# Patient Record
Sex: Female | Born: 1995 | Hispanic: Yes | Marital: Single | Smoking: Never smoker
Health system: Southern US, Community
[De-identification: ages and names within clinical notes are randomized; demographics above are authoritative.]

## PROBLEM LIST (undated history)

## (undated) ENCOUNTER — Inpatient Hospital Stay (HOSPITAL_COMMUNITY): Payer: Self-pay

## (undated) DIAGNOSIS — O139 Gestational [pregnancy-induced] hypertension without significant proteinuria, unspecified trimester: Secondary | ICD-10-CM

## (undated) DIAGNOSIS — R1110 Vomiting, unspecified: Secondary | ICD-10-CM

## (undated) HISTORY — PX: NO PAST SURGERIES: SHX2092

---
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
## 2016-07-12 IMAGING — US US OB COMP LESS 14 WK
1 series · 13 of 28 positions shown · non-contrast
Comparison: None.

CLINICAL DATA: Pelvic pain and vomiting. Estimated gestational age
by LMP is 6 weeks 0 days. Quantitative beta HCG is [DATE].

EXAM:
OBSTETRIC <14 WK US AND TRANSVAGINAL OB US
TECHNIQUE: Both transabdominal and transvaginal ultrasound examinations were
performed for complete evaluation of the gestation as well as the
maternal uterus, adnexal regions, and pelvic cul-de-sac.
Transvaginal technique was performed to assess early pregnancy.

[Series 1: us ob comp less 14 wk · 0.08mm/px · 13 of 54 slices shown]
[im 2/54]
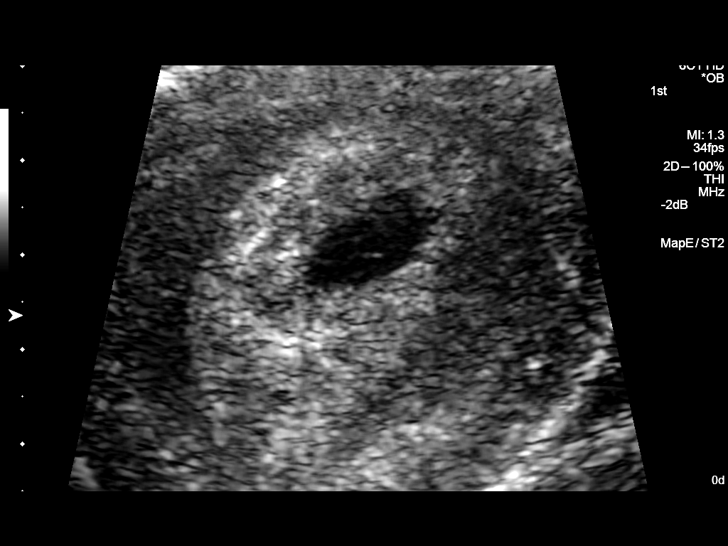
[im 6/54]
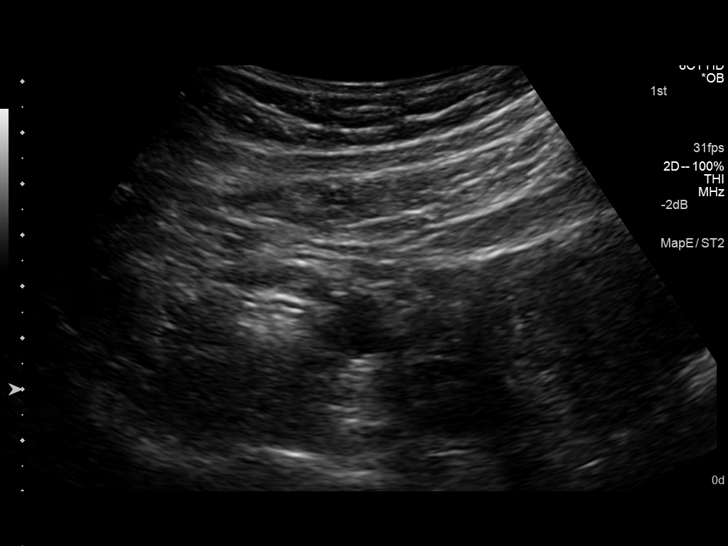
[im 10/54]
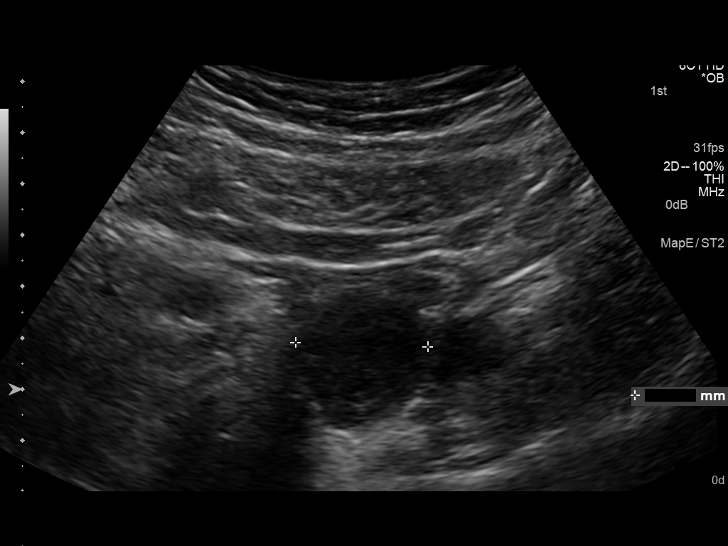
[im 14/54]
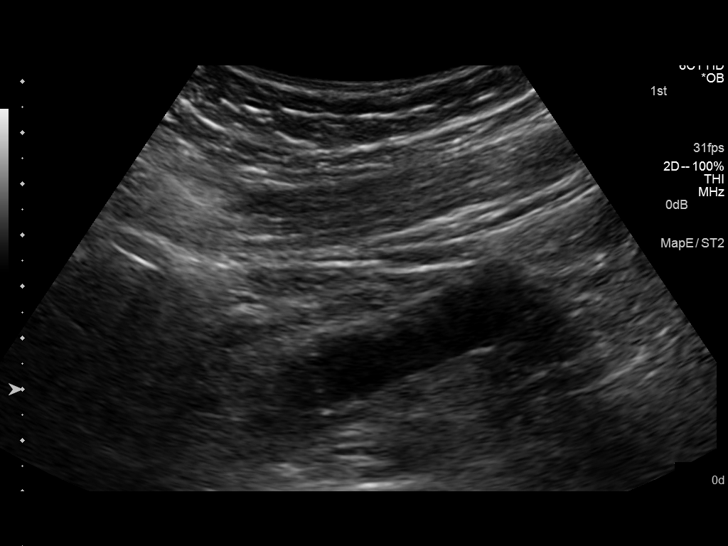
[im 18/54]
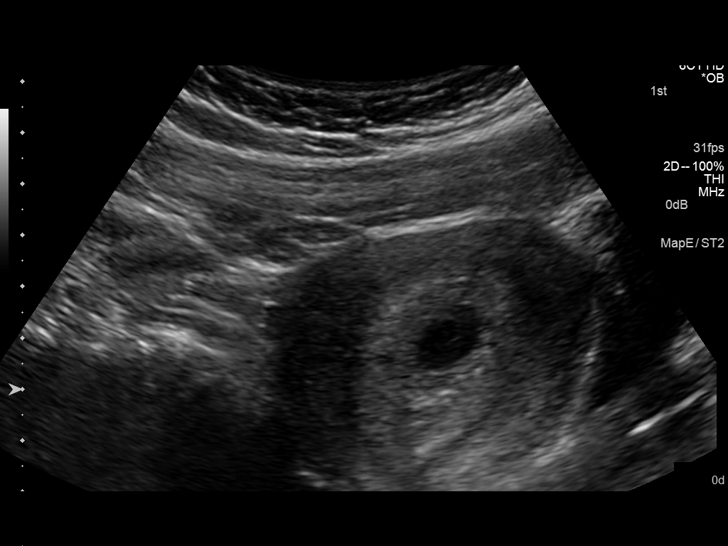
[im 22/54]
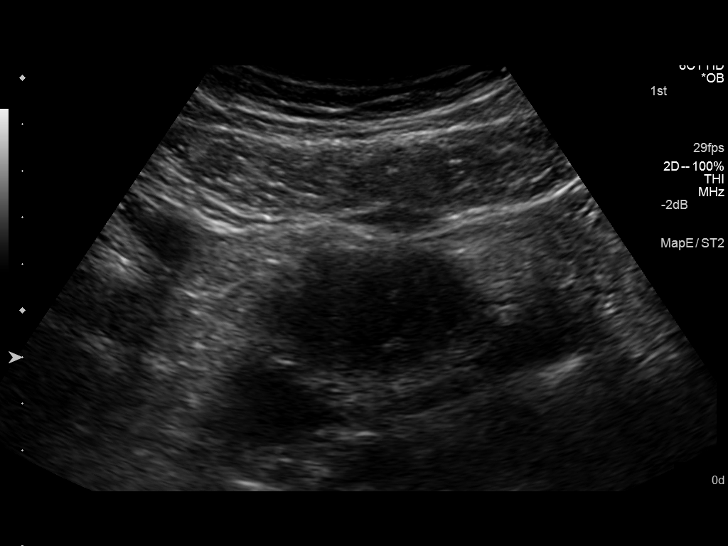
[im 28/54]
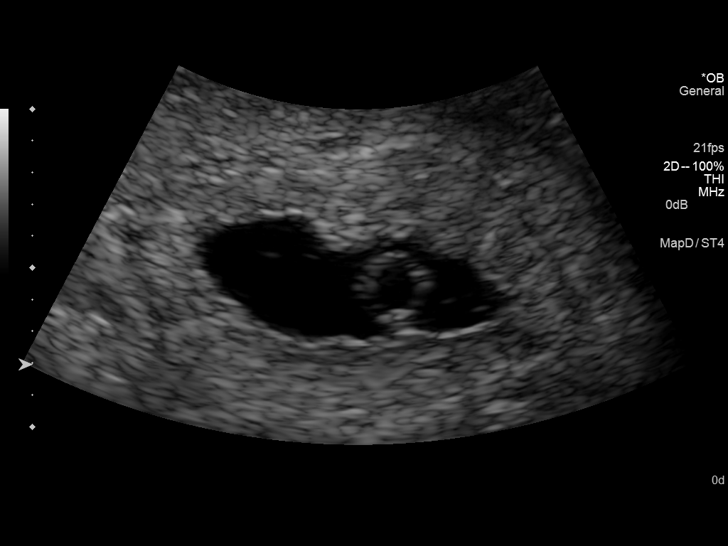
[im 32/54]
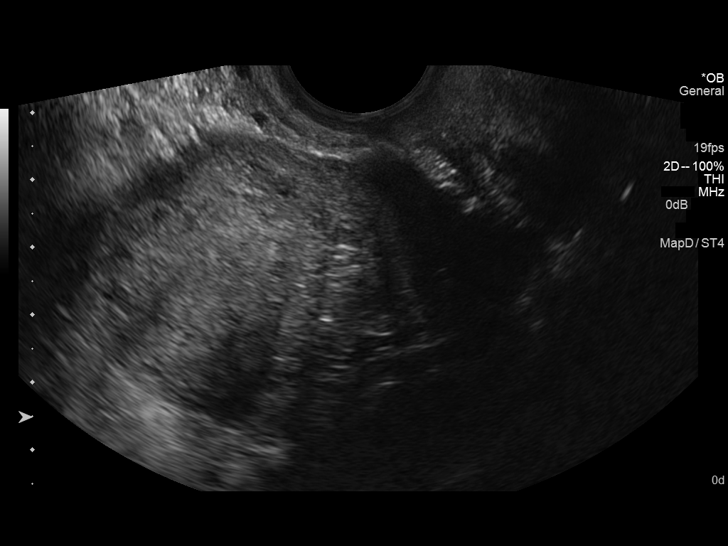
[im 36/54]
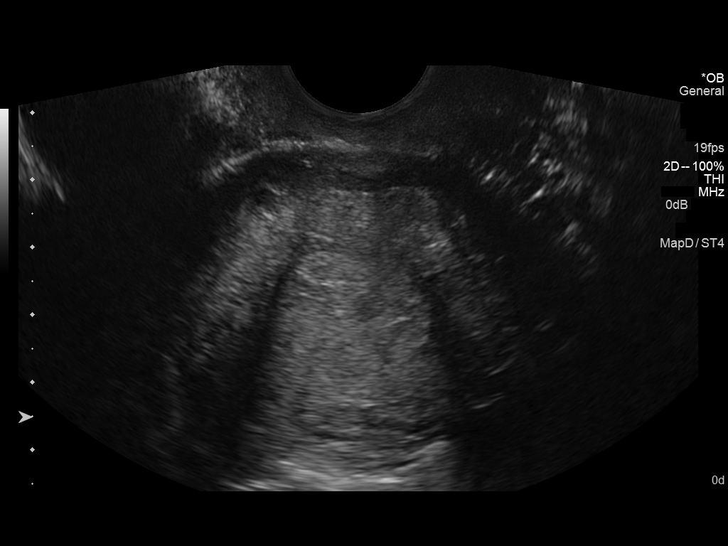
[im 40/54]
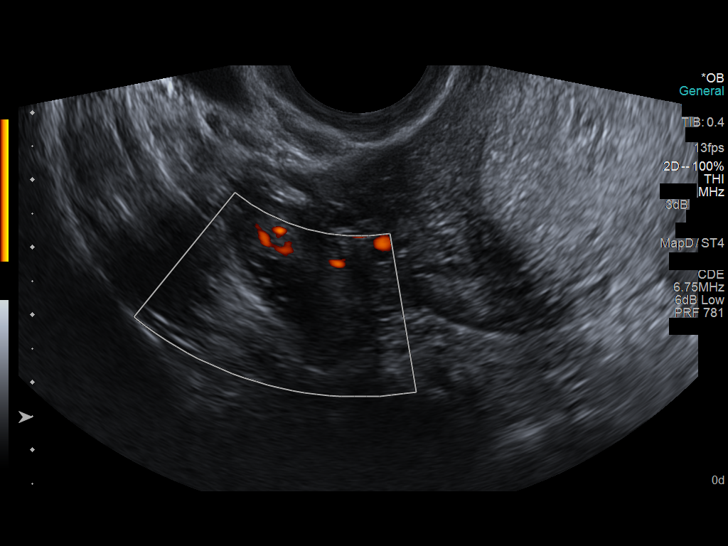
[im 44/54]
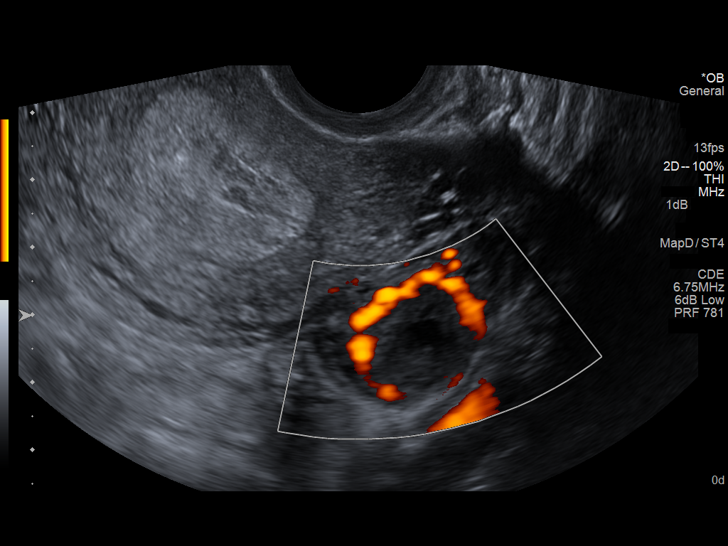
[im 48/54]
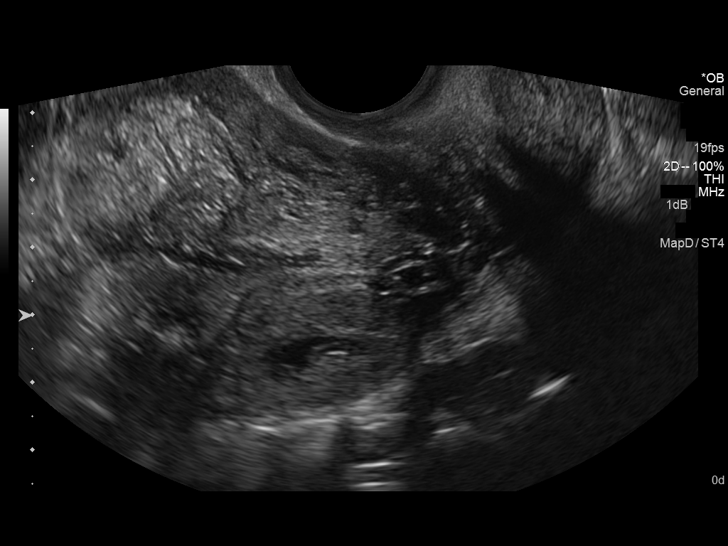
[im 52/54]
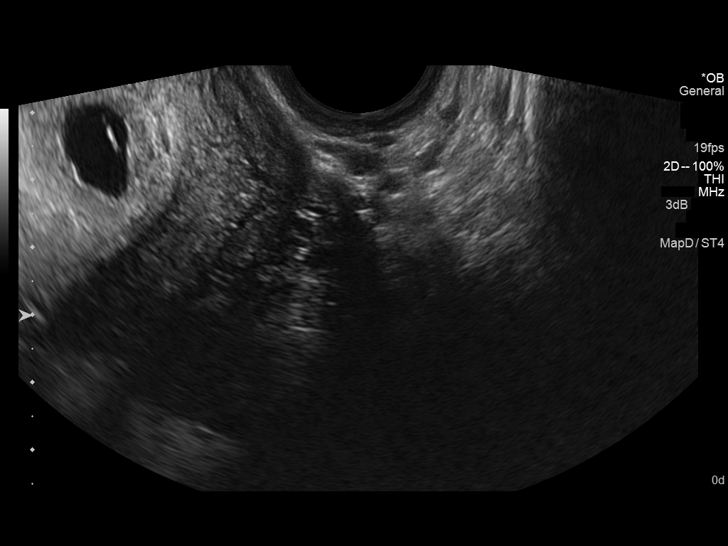

[13 of 28 positions shown; findings below may reference images not displayed]

FINDINGS: Intrauterine gestational sac: A single intrauterine gestational sac
is demonstrated.

Yolk sac:  Present

Embryo:  Present

Cardiac Activity: Present

Heart Rate:  91 bpm

CRL:   2.2  mm   5 w 5 d                  US EDC: 08/03/2014

Maternal uterus/adnexae: Uterus is anteverted. No myometrial mass
lesions identified. No subchorionic hemorrhage. Both ovaries are
identified. Right ovary appears normal. Cystic structure in the left
ovary with peripheral flow demonstrated consistent with corpus
luteum cyst. Trace free fluid around the left adnexal.
IMPRESSION: Single intrauterine pregnancy. Estimated gestational age by
crown-rump length is 5 weeks 5 days. Corpus luteum cyst on the left.
No acute complication is identified appear

## 2017-01-19 ENCOUNTER — Inpatient Hospital Stay (HOSPITAL_COMMUNITY)
Admission: AD | Admit: 2017-01-19 | Discharge: 2017-01-19 | Disposition: A | Discharge status: Home / Self Care | Payer: Self-pay | Source: Ambulatory Visit | Attending: Obstetrics & Gynecology | Admitting: Obstetrics & Gynecology

## 2017-01-19 ENCOUNTER — Encounter (HOSPITAL_COMMUNITY): Payer: Self-pay

## 2017-01-19 DIAGNOSIS — O26891 Other specified pregnancy related conditions, first trimester: Secondary | ICD-10-CM | POA: Insufficient documentation

## 2017-01-19 DIAGNOSIS — Z8759 Personal history of other complications of pregnancy, childbirth and the puerperium: Secondary | ICD-10-CM

## 2017-01-19 DIAGNOSIS — Z3A01 Less than 8 weeks gestation of pregnancy: Secondary | ICD-10-CM | POA: Insufficient documentation

## 2017-01-19 DIAGNOSIS — O210 Mild hyperemesis gravidarum: Secondary | ICD-10-CM | POA: Insufficient documentation

## 2017-01-19 DIAGNOSIS — R030 Elevated blood-pressure reading, without diagnosis of hypertension: Secondary | ICD-10-CM | POA: Insufficient documentation

## 2017-01-19 DIAGNOSIS — Z3480 Encounter for supervision of other normal pregnancy, unspecified trimester: Secondary | ICD-10-CM

## 2017-01-19 LAB — COMPREHENSIVE METABOLIC PANEL
ALT: 21 U/L (ref 14–54)
ANION GAP: 10 (ref 5–15)
AST: 26 U/L (ref 15–41)
Albumin: 4.7 g/dL (ref 3.5–5.0)
Alkaline Phosphatase: 48 U/L (ref 38–126)
BILIRUBIN TOTAL: 1 mg/dL (ref 0.3–1.2)
BUN: 17 mg/dL (ref 6–20)
CO2: 19 mmol/L — AB (ref 22–32)
Calcium: 9.4 mg/dL (ref 8.9–10.3)
Chloride: 105 mmol/L (ref 101–111)
Creatinine, Ser: 0.66 mg/dL (ref 0.44–1.00)
GFR calc Af Amer: >60 mL/min (ref >60)
GFR calc non Af Amer: >60 mL/min (ref >60)
Glucose, Bld: 74 mg/dL (ref 65–99)
POTASSIUM: 4.1 mmol/L (ref 3.5–5.1)
SODIUM: 134 mmol/L — AB (ref 135–145)
TOTAL PROTEIN: 8.6 g/dL — AB (ref 6.5–8.1)

## 2017-01-19 LAB — URINALYSIS, ROUTINE W REFLEX MICROSCOPIC
BILIRUBIN URINE: NEGATIVE
BILIRUBIN URINE: NEGATIVE
Glucose, UA: NEGATIVE mg/dL
HGB URINE DIPSTICK: NEGATIVE
Hgb urine dipstick: NEGATIVE
KETONES UR: 80 mg/dL — AB
KETONES UR: 80 mg/dL — AB
Nitrite: NEGATIVE
Nitrite: NEGATIVE
PH: 6 (ref 5.0–8.0)
PROTEIN: 100 mg/dL — AB
Protein, ur: NEGATIVE mg/dL
Specific Gravity, Urine: 1.032 — ABNORMAL HIGH (ref 1.005–1.030)
Specific Gravity, Urine: 1.011 (ref 1.005–1.030)
pH: 5 (ref 5.0–8.0)

## 2017-01-19 LAB — CBC
HCT: 39.3 % (ref 36.0–46.0)
Hemoglobin: 14 g/dL (ref 12.0–15.0)
MCH: 30.8 pg (ref 26.0–34.0)
MCHC: 35.6 g/dL (ref 30.0–36.0)
MCV: 86.4 fL (ref 78.0–100.0)
Platelets: 327 10*3/uL (ref 150–400)
RBC: 4.55 MIL/uL (ref 3.87–5.11)
RDW: 12.4 % (ref 11.5–15.5)
WBC: 11.9 10*3/uL — ABNORMAL HIGH (ref 4.0–10.5)

## 2017-01-19 LAB — PREGNANCY, URINE: Preg Test, Ur: POSITIVE — AB

## 2017-01-19 LAB — POCT PREGNANCY, URINE: PREG TEST UR: POSITIVE — AB

## 2017-01-19 MED ORDER — PROMETHAZINE HCL 25 MG PO TABS: 25.0000 mg | ORAL_TABLET | Freq: Four times a day (QID) | ORAL | 1 refills | Status: DC | PRN

## 2017-01-19 MED ORDER — M.V.I. ADULT IV INJ
Freq: Once | INTRAVENOUS | Status: AC
  Administered 2017-01-19: 17:00:00 via INTRAVENOUS
  Filled 2017-01-19: qty 1000

## 2017-01-19 MED ORDER — PROMETHAZINE HCL 25 MG RE SUPP: 25.0000 mg | Freq: Four times a day (QID) | RECTAL | 0 refills | Status: DC | PRN

## 2017-01-19 MED ORDER — PROMETHAZINE HCL 25 MG/ML IJ SOLN
25.0000 mg | Freq: Once | INTRAVENOUS | Status: AC
  Administered 2017-01-19: 25 mg via INTRAVENOUS
  Filled 2017-01-19: qty 1

## 2017-01-19 NOTE — Progress Notes (Addendum)
G2P1 @ [redacted] wksga. Presents to triage for n/v and dehydration. Denies bleeding or cramping. Hx of hyperemises and GHTN so induced @ [redacted] wksga. Denies bleeding.   Hx of hyperemesis and GHTN with previous pregn @ [redacted] wksga.   1547: provider at bs assessing pt.   1614: Awaiting for LR phenergan. Pharmacy notified. Lab at bs and informed pt will need IV so will draw labs via IV site.   1625: Labs and IV started. LR with phenergan up per order on pump set at 52100ml/hr. Labs tubed to lab  1823: Provider at bs reassessing pt. Aware of the high bp 138/95. Up to bathroom   1830: new orders for U/a  1840: provider ordered to d/c pt home.  1845: Discharge instructions given with pt understanding. Pt instructed to call clinic for BP monitoring. Pt verbalizes understanding. Pt left unit via ambulatory with SO.

## 2017-01-19 NOTE — Discharge Instructions (Signed)
Expect the clinic to call you to schedule an appointment to come in in 2 weeks to have your blood pressure checked. Use the BRAT diet - Bananas, rice, applesauce, and toast - no butter or oils - to get you started back with foods or use liquids and then advance to soft foods. Drink at least 8 8-oz glasses of water every day. Take Tylenol 325 mg 2 tablets by mouth every 4 hours if needed for pain. [facility]. Get the medications prescribed from your pharmacy.   Use the Phenergan tablets - they will cause drowsiness. If you are not able to keep down the tablets, you can use the phenergan suppositories in the vagina or in the rectum (hold for at least 20 minutes so the medication can be absorbed). Use Vitamin B6 25 or 50 mg one tablet at bedtime and Unisom (plain) one tablet at bedtime to help with the nausea.  You can use these in addition to the Phenergan tablets.

## 2017-01-19 NOTE — MAU Provider Note (Signed)
History     CSN: [id number]  Arrival date and time: 01/19/17 1502   First Provider Initiated Contact with Patient 01/19/17 1543      Chief Complaint  Patient presents with  . Emesis  . Dehydration   HPI [name] 21 y.o. 6518w0d  Comes to MAU with repetitive vomiting in early pregnancy since Thursday.  Is now starting to feel weak and dizzy.  Had Hyperemesis gravidarium and had IVF several times with her last pregnancy approx. 2.5 years ago.  Does not have any medications at home.  Has noticed her urine is very dark.    Last pregnancy she took Zofran which did not work.  The vomiting resolved at about 14 weeks into the pregnancy.  Also of note, she was induced at 365w4d last pregnancy for gestational hypertension.    OB History    Gravida Para Term Preterm AB Living   2 1 1     1    SAB TAB Ectopic Multiple Live Births         0 1      Past Medical History:  Diagnosis Date  . Medical history non-contributory     Past Surgical History:  Procedure Laterality Date  . NO PAST SURGERIES      Family History  Problem Relation Age of Onset  . Miscarriages / [place] Mother   . Hyperlipidemia Father   . Diabetes Maternal Grandmother   . Hypertension Maternal Grandfather     Social History  Substance Use Topics  . Smoking status: Never Smoker  . Smokeless tobacco: Never Used  . Alcohol use No    Allergies: No Known Allergies  Prescriptions Prior to Admission  Medication Sig Dispense Refill Last Dose  . ibuprofen (ADVIL,MOTRIN) 600 MG tablet Take 1 tablet (600 mg total) by mouth every 6 (six) hours. 30 tablet 0   . oxyCODONE-acetaminophen (PERCOCET/ROXICET) 5-325 MG per tablet Take 1 tablet by mouth every 4 (four) hours as needed (for pain scale 4-7). 30 tablet 0   . Prenatal Vit-Fe Fumarate-FA (PRENATAL MULTIVITAMIN) TABS tablet Take 1 tablet by mouth daily.    07/21/2014 at Unknown time    Review of Systems  Constitutional: Negative for fever.   Gastrointestinal: Positive for nausea and vomiting. Negative for abdominal pain and diarrhea.  Genitourinary: Negative for dysuria, vaginal bleeding and vaginal discharge.   Physical Exam   Temperature 99 F (37.2 C), temperature source Oral, resp. rate 18, last menstrual period 12/08/2016, unknown if currently breastfeeding.  Physical Exam  Nursing note and vitals reviewed. Constitutional: She is oriented to person, place, and time. She appears well-developed and well-nourished.  HENT:  Head: Normocephalic.  Eyes: EOM are normal.  Neck: Neck supple.  GI: Soft. There is no tenderness.  Musculoskeletal: Normal range of motion.  Neurological: She is alert and oriented to person, place, and time.  Skin: Skin is warm and dry.  Psychiatric: She has a normal mood and affect.    MAU Course  Procedures Results for orders placed or performed during the hospital encounter of 01/19/17 (from the past 24 hour(s))  Pregnancy, urine     Status: Abnormal   Collection Time: 01/19/17  3:30 PM  Result Value Ref Range   Preg Test, Ur POSITIVE (A) NEGATIVE  Urinalysis, Routine w reflex microscopic     Status: Abnormal   Collection Time: 01/19/17  3:30 PM  Result Value Ref Range   Color, Urine YELLOW YELLOW   APPearance CLOUDY (A) CLEAR   Specific  Gravity, Urine 1.032 (H) 1.005 - 1.030   pH 5.0 5.0 - 8.0   Glucose, UA NEGATIVE NEGATIVE mg/dL   Hgb urine dipstick NEGATIVE NEGATIVE   Bilirubin Urine NEGATIVE NEGATIVE   Ketones, ur 80 (A) NEGATIVE mg/dL   Protein, ur 696 (A) NEGATIVE mg/dL   Nitrite NEGATIVE NEGATIVE   Leukocytes, UA MODERATE (A) NEGATIVE   RBC / HPF 6-30 0 - 5 RBC/hpf   WBC, UA TOO NUMEROUS TO COUNT 0 - 5 WBC/hpf   Bacteria, UA MANY (A) NONE SEEN   Squamous Epithelial / LPF 6-30 (A) NONE SEEN   Mucus PRESENT   Pregnancy, urine POC     Status: Abnormal   Collection Time: 01/19/17  3:35 PM  Result Value Ref Range   Preg Test, Ur POSITIVE (A) NEGATIVE  CBC     Status:  Abnormal   Collection Time: 01/19/17  4:25 PM  Result Value Ref Range   WBC 11.9 (H) 4.0 - 10.5 K/uL   RBC 4.55 3.87 - 5.11 MIL/uL   Hemoglobin 14.0 12.0 - 15.0 g/dL   HCT 29.5 28.4 - 13.2 %   MCV 86.4 78.0 - 100.0 fL   MCH 30.8 26.0 - 34.0 pg   MCHC 35.6 30.0 - 36.0 g/dL   RDW 44.0 10.2 - 72.5 %   Platelets 327 150 - 400 K/uL  Comprehensive metabolic panel     Status: Abnormal   Collection Time: 01/19/17  4:25 PM  Result Value Ref Range   Sodium 134 (L) 135 - 145 mmol/L   Potassium 4.1 3.5 - 5.1 mmol/L   Chloride 105 101 - 111 mmol/L   CO2 19 (L) 22 - 32 mmol/L   Glucose, Bld 74 65 - 99 mg/dL   BUN 17 6 - 20 mg/dL   Creatinine, Ser 3.66 0.44 - 1.00 mg/dL   Calcium 9.4 8.9 - 44.0 mg/dL   Total Protein 8.6 (H) 6.5 - 8.1 g/dL   Albumin 4.7 3.5 - 5.0 g/dL   AST 26 15 - 41 U/L   ALT 21 14 - 54 U/L   Alkaline Phosphatase 48 38 - 126 U/L   Total Bilirubin 1.0 0.3 - 1.2 mg/dL   GFR calc non Af Amer >60 >60 mL/min   GFR calc Af Amer >60 >60 mL/min   Anion gap 10 5 - 15    MDM With the repetitive vomiting and feeling dizzy, will give IVF today - one bag of LR with Phenergan 25 mg and one bag of MVI.  Discussed with client Vitamin B6 with Unisom at night - Does not have insurance.  Discussed prescribing Phenergan tablets and Phenergan suppositories if unable to keep down the tablets.    BP at discharge 139/95 - consult with Dr. Erin Fulling - will arrange for a problem visit in the clinic to recheck vomiting and BP.  [facility]  Will repeat UA today after IVF.  Assessment and Plan  Vomiting in first trimester with dehydration Elevated BP without diagnosis of hypertension  Plan [facility] to [facility] care. Get the medications prescribed from your pharmacy.   Use the Phenergan tablets - they will cause drowsiness. If you are not able to keep down the tablets, you can use the phenergan suppositories  in the vagina or in the rectum (hold for at least 20 minutes so the medication can be absorbed). Use Vitamin B6 25 or 50 mg one tablet  at bedtime and Unisom (plain) one tablet at bedtime to help with the nausea.  You can use these in addition to the Phenergan tablets. Expect the clinic to call to schedule an appointment to schedule and appointment in 2 weeks to come in and have your blood pressure checked.  Jeanluc Wegman L Sumner Boesch 01/19/2017, 3:53 PM

## 2017-01-24 ENCOUNTER — Telehealth: Payer: Self-pay | Admitting: Advanced Practice Midwife

## 2017-01-24 ENCOUNTER — Encounter (HOSPITAL_COMMUNITY): Payer: Self-pay | Admitting: *Deleted

## 2017-01-24 ENCOUNTER — Inpatient Hospital Stay (HOSPITAL_COMMUNITY)
Admission: AD | Admit: 2017-01-24 | Discharge: 2017-01-24 | Disposition: A | Discharge status: Home / Self Care | Payer: Self-pay | Source: Ambulatory Visit | Attending: Obstetrics and Gynecology | Admitting: Obstetrics and Gynecology

## 2017-01-24 DIAGNOSIS — O219 Vomiting of pregnancy, unspecified: Secondary | ICD-10-CM

## 2017-01-24 DIAGNOSIS — Z3480 Encounter for supervision of other normal pregnancy, unspecified trimester: Secondary | ICD-10-CM

## 2017-01-24 DIAGNOSIS — O26891 Other specified pregnancy related conditions, first trimester: Secondary | ICD-10-CM | POA: Insufficient documentation

## 2017-01-24 DIAGNOSIS — R112 Nausea with vomiting, unspecified: Secondary | ICD-10-CM | POA: Insufficient documentation

## 2017-01-24 DIAGNOSIS — R0602 Shortness of breath: Secondary | ICD-10-CM | POA: Insufficient documentation

## 2017-01-24 DIAGNOSIS — Z8759 Personal history of other complications of pregnancy, childbirth and the puerperium: Secondary | ICD-10-CM

## 2017-01-24 DIAGNOSIS — Z3A01 Less than 8 weeks gestation of pregnancy: Secondary | ICD-10-CM | POA: Insufficient documentation

## 2017-01-24 DIAGNOSIS — R12 Heartburn: Secondary | ICD-10-CM | POA: Insufficient documentation

## 2017-01-24 LAB — CBC
HCT: 40.8 % (ref 36.0–46.0)
Hemoglobin: 14.9 g/dL (ref 12.0–15.0)
MCH: 30.9 pg (ref 26.0–34.0)
MCHC: 36.5 g/dL — ABNORMAL HIGH (ref 30.0–36.0)
MCV: 84.6 fL (ref 78.0–100.0)
PLATELETS: 294 10*3/uL (ref 150–400)
RBC: 4.82 MIL/uL (ref 3.87–5.11)
RDW: 12.5 % (ref 11.5–15.5)
WBC: 12.7 10*3/uL — ABNORMAL HIGH (ref 4.0–10.5)

## 2017-01-24 LAB — COMPREHENSIVE METABOLIC PANEL
ALT: 26 U/L (ref 14–54)
AST: 24 U/L (ref 15–41)
Albumin: 5.1 g/dL — ABNORMAL HIGH (ref 3.5–5.0)
Alkaline Phosphatase: 56 U/L (ref 38–126)
Anion gap: 16 — ABNORMAL HIGH (ref 5–15)
BUN: 20 mg/dL (ref 6–20)
CHLORIDE: 103 mmol/L (ref 101–111)
CO2: 12 mmol/L — ABNORMAL LOW (ref 22–32)
Calcium: 10 mg/dL (ref 8.9–10.3)
Creatinine, Ser: 0.79 mg/dL (ref 0.44–1.00)
Glucose, Bld: 90 mg/dL (ref 65–99)
Potassium: 4.2 mmol/L (ref 3.5–5.1)
Sodium: 131 mmol/L — ABNORMAL LOW (ref 135–145)
Total Bilirubin: 1.2 mg/dL (ref 0.3–1.2)
Total Protein: 9.2 g/dL — ABNORMAL HIGH (ref 6.5–8.1)

## 2017-01-24 LAB — URINALYSIS, ROUTINE W REFLEX MICROSCOPIC
BILIRUBIN URINE: NEGATIVE
GLUCOSE, UA: NEGATIVE mg/dL
KETONES UR: 80 mg/dL — AB
Nitrite: NEGATIVE
Protein, ur: 100 mg/dL — AB
SPECIFIC GRAVITY, URINE: 1.032 — AB (ref 1.005–1.030)
pH: 5 (ref 5.0–8.0)

## 2017-01-24 MED ORDER — PROMETHAZINE HCL 25 MG/ML IJ SOLN
25.0000 mg | Freq: Four times a day (QID) | INTRAMUSCULAR | Status: DC | PRN
  Administered 2017-01-24: 25 mg via INTRAVENOUS
  Filled 2017-01-24: qty 1

## 2017-01-24 MED ORDER — FAMOTIDINE IN NACL 20-0.9 MG/50ML-% IV SOLN
20.0000 mg | Freq: Once | INTRAVENOUS | Status: AC
  Administered 2017-01-24: 20 mg via INTRAVENOUS

## 2017-01-24 MED ORDER — RANITIDINE HCL 150 MG PO TABS: 150.0000 mg | ORAL_TABLET | Freq: Two times a day (BID) | ORAL | 5 refills | Status: DC

## 2017-01-24 MED ORDER — DEXTROSE 5 % IN LACTATED RINGERS IV BOLUS
1000.0000 mL | Freq: Once | INTRAVENOUS | Status: AC
  Administered 2017-01-24: 1000 mL via INTRAVENOUS

## 2017-01-24 MED ORDER — METOCLOPRAMIDE HCL 5 MG/ML IJ SOLN
10.0000 mg | Freq: Once | INTRAMUSCULAR | Status: AC
  Administered 2017-01-24: 10 mg via INTRAVENOUS
  Filled 2017-01-24: qty 2

## 2017-01-24 MED ORDER — PROMETHAZINE HCL 25 MG PO TABS: 25.0000 mg | ORAL_TABLET | Freq: Four times a day (QID) | ORAL | 5 refills | Status: DC | PRN

## 2017-01-24 MED ORDER — FAMOTIDINE IN NACL 20-0.9 MG/50ML-% IV SOLN
INTRAVENOUS | Status: AC
  Filled 2017-01-24: qty 50

## 2017-01-24 MED ORDER — M.V.I. ADULT IV INJ
Freq: Once | INTRAVENOUS | Status: AC
  Administered 2017-01-24: 16:00:00 via INTRAVENOUS
  Filled 2017-01-24: qty 1000

## 2017-01-24 MED ORDER — METOCLOPRAMIDE HCL 10 MG PO TABS: 10.0000 mg | ORAL_TABLET | Freq: Three times a day (TID) | ORAL | 5 refills | Status: DC

## 2017-01-24 NOTE — MAU Note (Signed)
In to assess Patient. Observed Patient sitting up in bed with her hands on her legs. Patient states, "Sometimes my legs will move like this (Patient making jerking motion with her legs), so I have to hold them." Patient denies any pain or discomfort at this time. Instructed Patient to remove her hands from her legs so that the reported jerking motion could be observed. No jerking motion observed at this time. Sharen CounterLisa Leftwich-Kirby, CNM made aware.

## 2017-01-24 NOTE — MAU Provider Note (Signed)
Chief Complaint: Emesis; Shortness of Breath; and Breast Pain   First Provider Initiated Contact with Patient 01/24/17 1430      SUBJECTIVE HPI: Patricia [name] is a 21 y.o. G2P1001 at [redacted]w[redacted]d by LMP who presents to maternity admissions reporting n/v and pt unable to keep down food or fluids for more than 24 hours.  She reports 14 lb weight loss in this pregnancy. She had hyperemesis with last pregnancy and required IV fluids multiple times. She is taking phenergan at home which is not helping. She last took a dose by mouth last night. She has not tried the medication vaginally but reports this was discussed with her at her last MAU visit.  She reports shortness of breath that occurred last night after she was crying and upset about something at home.  She denies shortness of breath now in MAU. There are no other associated symptoms.   She denies abdominal pain, vaginal bleeding, vaginal itching/burning, urinary symptoms, h/a, dizziness, or fever/chills.     HPI  Past Medical History:  Diagnosis Date  . Medical history non-contributory    Past Surgical History:  Procedure Laterality Date  . NO PAST SURGERIES     Social History   Social History  . Marital status: Single    Spouse name: N/A  . Number of children: N/A  . Years of education: N/A   Occupational History  . Not on file.   Social History Main Topics  . Smoking status: Never Smoker  . Smokeless tobacco: Never Used  . Alcohol use No  . Drug use: No  . Sexual activity: Yes    Birth control/ protection: None   Other Topics Concern  . Not on file   Social History Narrative  . No narrative on file   No current facility-administered medications on file prior to encounter.    Current Outpatient Prescriptions on File Prior to Encounter  Medication Sig Dispense Refill  . Prenatal Vit-Fe Fumarate-FA (PRENATAL MULTIVITAMIN) TABS tablet Take 1 tablet by mouth daily.      No Known Allergies  ROS:  Review of Systems   Constitutional: Negative for chills, fatigue and fever.  Respiratory: Negative for shortness of breath.   Cardiovascular: Negative for chest pain.  Gastrointestinal: Positive for nausea and vomiting. Negative for abdominal pain.  Genitourinary: Negative for difficulty urinating, dysuria, flank pain, pelvic pain, vaginal bleeding, vaginal discharge and vaginal pain.  Neurological: Negative for dizziness and headaches.  Psychiatric/Behavioral: Negative.      I have reviewed patient's Past Medical Hx, Surgical Hx, Family Hx, Social Hx, medications and allergies.   Physical Exam   Patient Vitals for the past 24 hrs:  BP Temp Temp src Pulse Resp SpO2 Weight  01/24/17 1730 127/88 98.5 F (36.9 C) Oral 83 16 - -  01/24/17 1636 130/67 99 F (37.2 C) Oral 73 16 - -  01/24/17 1343 (!) 127/95 99.3 F (37.4 C) Oral (!) 108 14 100 % 146 lb 12 oz (66.6 kg)  01/24/17 1341 - - - - - 100 % -   Constitutional: Well-developed, well-nourished female in no acute distress.  HEART: normal rate, heart sounds, regular rhythm RESP: normal effort, lung sounds clear and equal bilaterally GI: Abd soft, non-tender. Pos BS x 4 MS: Extremities nontender, no edema, normal ROM Neurologic: Alert and oriented x 4.  GU: Neg CVAT.  PELVIC EXAM: deferred   LAB RESULTS Results for orders placed or performed during the hospital encounter of 01/24/17 (from the past 24 hour(s))  Urinalysis, Routine w reflex microscopic     Status: Abnormal   Collection Time: 01/24/17  1:47 PM  Result Value Ref Range   Color, Urine AMBER (A) YELLOW   APPearance CLOUDY (A) CLEAR   Specific Gravity, Urine 1.032 (H) 1.005 - 1.030   pH 5.0 5.0 - 8.0   Glucose, UA NEGATIVE NEGATIVE mg/dL   Hgb urine dipstick SMALL (A) NEGATIVE   Bilirubin Urine NEGATIVE NEGATIVE   Ketones, ur 80 (A) NEGATIVE mg/dL   Protein, ur 098 (A) NEGATIVE mg/dL   Nitrite NEGATIVE NEGATIVE   Leukocytes, UA MODERATE (A) NEGATIVE   RBC / HPF 6-30 0 - 5  RBC/hpf   WBC, UA TOO NUMEROUS TO COUNT 0 - 5 WBC/hpf   Bacteria, UA RARE (A) NONE SEEN   Squamous Epithelial / LPF TOO NUMEROUS TO COUNT (A) NONE SEEN   Mucus PRESENT   CBC     Status: Abnormal   Collection Time: 01/24/17  2:40 PM  Result Value Ref Range   WBC 12.7 (H) 4.0 - 10.5 K/uL   RBC 4.82 3.87 - 5.11 MIL/uL   Hemoglobin 14.9 12.0 - 15.0 g/dL   HCT 11.9 14.7 - 82.9 %   MCV 84.6 78.0 - 100.0 fL   MCH 30.9 26.0 - 34.0 pg   MCHC 36.5 (H) 30.0 - 36.0 g/dL   RDW 56.2 13.0 - 86.5 %   Platelets 294 150 - 400 K/uL  Comprehensive metabolic panel     Status: Abnormal   Collection Time: 01/24/17  2:40 PM  Result Value Ref Range   Sodium 131 (L) 135 - 145 mmol/L   Potassium 4.2 3.5 - 5.1 mmol/L   Chloride 103 101 - 111 mmol/L   CO2 12 (L) 22 - 32 mmol/L   Glucose, Bld 90 65 - 99 mg/dL   BUN 20 6 - 20 mg/dL   Creatinine, Ser 7.84 0.44 - 1.00 mg/dL   Calcium 69.6 8.9 - 29.5 mg/dL   Total Protein 9.2 (H) 6.5 - 8.1 g/dL   Albumin 5.1 (H) 3.5 - 5.0 g/dL   AST 24 15 - 41 U/L   ALT 26 14 - 54 U/L   Alkaline Phosphatase 56 38 - 126 U/L   Total Bilirubin 1.2 0.3 - 1.2 mg/dL   GFR calc non Af Amer >60 >60 mL/min   GFR calc Af Amer >60 >60 mL/min   Anion gap 16 (H) 5 - 15       IMAGING No results found.  MAU Management/MDM: Ordered labs and reviewed results.  No evidence of anemia or electrolyte imbalance.  Pt shortness of breath appears associated with emotional upset last night, not with n/v.  She is not SOB today.  Dehydration noted with high specific gravity and 80 ketones so IV fluids given with D5LR x 1000 ml and MVI x 1000 ml. Phenergan 25 mg IV, Pepcid 20 mg IV, and Reglan 10 mg IV given. Pt did have mild reaction to both medications, with 5-10 minutes of restless legs/foot twitching that resolved spontaneously.  Pt reports improvement in n/v symptoms.  Pt reports she has had similar but more mild reaction to PO Phenergan.  Tolerated PO fluids/food in MAU.  D/C home with Rx for  Reglan to take in daytime PRN, Phenergan at night.  Encouraged pt to use Phenergan vaginally when not tolerating PO.  Zantac 150 mg BID daily.  F/U as scheduled at [facility] for prenatal care.  Return to MAU as needed for emergencies.  Pt discharged with strict precautions/reasons to return.  ASSESSMENT 1. Nausea and vomiting during pregnancy prior to [redacted] weeks gestation   2. Supervision of other normal pregnancy, antepartum   3. History of gestational hypertension   4. Heartburn during pregnancy, antepartum, first trimester   5. Shortness of breath due to pregnancy in first trimester     PLAN Discharge home Allergies as of 01/24/2017   No Known Allergies     Medication List    TAKE these medications   metoCLOPramide 10 MG tablet Commonly known as:  REGLAN Take 1 tablet (10 mg total) by mouth 3 (three) times daily before meals.   prenatal multivitamin Tabs tablet Take 1 tablet by mouth daily.   promethazine 25 MG tablet Commonly known as:  PHENERGAN Take 1 tablet (25 mg total) by mouth every 6 (six) hours as needed for nausea or vomiting. What changed:  Another medication with the same name was removed. Continue taking this medication, and follow the directions you see here.   ranitidine 150 MG tablet Commonly known as:  ZANTAC Take 1 tablet (150 mg total) by mouth 2 (two) times daily.      Follow-up Information    [facility].   Why:  As scheduled for prenatal care, return to MAU as needed for emergencies Contact information: [address] [place] [address] [phone]          Addendum:  After pt discharge, reviewed safety of Reglan and Phenergan, given pt mild dystonia reaction in MAU.  Consulted Dr Despina HiddenEure and pharmacy.  Contacted pt to encourage close observation of symptoms. If symptoms worsen or persist, she should d/c meds and seek care.  See separate telephone note.  Sharen CounterLisa Leftwich-Kirby Certified  Nurse-Midwife 01/24/2017  8:15 PM

## 2017-01-24 NOTE — Telephone Encounter (Signed)
Pt was seen tonight in MAU.  After consult with Sue LushAndrea, [facility] pharmacist and with Dr Despina HiddenEure, called pt to discuss changes. Left message for pt to return call.  Pt had mild dystonic reaction to medications given IV in MAU, see MAU note, and reported occasional mild reaction to PO Phenergan in last 2 weeks.  Considered discontinuing Phenergan and Reglan and using Zofran but risks of Zofran in first trimester are also a concern so per Dr Despina HiddenEure, continuing medications with close surveillance is warranted.  Pt ok to take Reglan PRN in the daytime and Phenergan PRN at night but if she has an increase in her reaction to the medication, she should discontinue and follow up with OB/gyn or in MAU.

## 2017-01-24 NOTE — MAU Note (Signed)
Had been taking Promethazine, stopped working yesterday.  Continued to throw up all day. Has lost 14 # with the preg. Since last night she has felt like she can't breath, can't catch her breath. (talking with out pause)  Last night she started having pain in her rt nipple, feels like she is being poked.

## 2017-01-25 LAB — CULTURE, OB URINE: Culture: NO GROWTH

## 2017-01-27 ENCOUNTER — Inpatient Hospital Stay (HOSPITAL_COMMUNITY): Payer: Self-pay

## 2017-01-27 ENCOUNTER — Encounter (HOSPITAL_COMMUNITY): Payer: Self-pay | Admitting: *Deleted

## 2017-01-27 ENCOUNTER — Inpatient Hospital Stay (HOSPITAL_COMMUNITY)
Admission: AD | Admit: 2017-01-27 | Discharge: 2017-01-27 | Disposition: A | Discharge status: Home / Self Care | Payer: Self-pay | Source: Ambulatory Visit | Attending: Obstetrics & Gynecology | Admitting: Obstetrics & Gynecology

## 2017-01-27 DIAGNOSIS — O98811 Other maternal infectious and parasitic diseases complicating pregnancy, first trimester: Secondary | ICD-10-CM | POA: Insufficient documentation

## 2017-01-27 DIAGNOSIS — B373 Candidiasis of vulva and vagina: Secondary | ICD-10-CM | POA: Insufficient documentation

## 2017-01-27 DIAGNOSIS — O9989 Other specified diseases and conditions complicating pregnancy, childbirth and the puerperium: Secondary | ICD-10-CM

## 2017-01-27 DIAGNOSIS — Z8759 Personal history of other complications of pregnancy, childbirth and the puerperium: Secondary | ICD-10-CM

## 2017-01-27 DIAGNOSIS — Z3480 Encounter for supervision of other normal pregnancy, unspecified trimester: Secondary | ICD-10-CM

## 2017-01-27 DIAGNOSIS — O26891 Other specified pregnancy related conditions, first trimester: Secondary | ICD-10-CM | POA: Insufficient documentation

## 2017-01-27 DIAGNOSIS — O209 Hemorrhage in early pregnancy, unspecified: Secondary | ICD-10-CM | POA: Insufficient documentation

## 2017-01-27 DIAGNOSIS — B3731 Acute candidiasis of vulva and vagina: Secondary | ICD-10-CM

## 2017-01-27 DIAGNOSIS — Z3A01 Less than 8 weeks gestation of pregnancy: Secondary | ICD-10-CM | POA: Insufficient documentation

## 2017-01-27 DIAGNOSIS — N898 Other specified noninflammatory disorders of vagina: Secondary | ICD-10-CM | POA: Insufficient documentation

## 2017-01-27 DIAGNOSIS — R109 Unspecified abdominal pain: Secondary | ICD-10-CM | POA: Insufficient documentation

## 2017-01-27 DIAGNOSIS — Z3491 Encounter for supervision of normal pregnancy, unspecified, first trimester: Secondary | ICD-10-CM

## 2017-01-27 DIAGNOSIS — Z8679 Personal history of other diseases of the circulatory system: Secondary | ICD-10-CM | POA: Insufficient documentation

## 2017-01-27 LAB — WET PREP, GENITAL
Clue Cells Wet Prep HPF POC: NONE SEEN
SPERM: NONE SEEN
Trich, Wet Prep: NONE SEEN
Yeast Wet Prep HPF POC: NONE SEEN

## 2017-01-27 LAB — URINALYSIS, ROUTINE W REFLEX MICROSCOPIC
BILIRUBIN URINE: NEGATIVE
GLUCOSE, UA: NEGATIVE mg/dL
Ketones, ur: 80 mg/dL — AB
Nitrite: NEGATIVE
Specific Gravity, Urine: 1.029 (ref 1.005–1.030)
pH: 6 (ref 5.0–8.0)

## 2017-01-27 LAB — CBC
HEMATOCRIT: 36.3 % (ref 36.0–46.0)
Hemoglobin: 13.4 g/dL (ref 12.0–15.0)
MCH: 31.5 pg (ref 26.0–34.0)
MCHC: 36.9 g/dL — AB (ref 30.0–36.0)
MCV: 85.4 fL (ref 78.0–100.0)
PLATELETS: 275 10*3/uL (ref 150–400)
RBC: 4.25 MIL/uL (ref 3.87–5.11)
RDW: 12.3 % (ref 11.5–15.5)
WBC: 13.6 10*3/uL — ABNORMAL HIGH (ref 4.0–10.5)

## 2017-01-27 LAB — HCG, QUANTITATIVE, PREGNANCY: hCG, Beta Chain, Quant, S: 112330 m[IU]/mL — ABNORMAL HIGH (ref <5)

## 2017-01-27 MED ORDER — TERCONAZOLE 0.4 % VA CREA: 1.0000 | TOPICAL_CREAM | Freq: Every day | VAGINAL | 0 refills | Status: DC

## 2017-01-27 NOTE — MAU Note (Signed)
Everything started on Sat night.  Noted red when she peed, some lt brown when she wiped.  Started cramping, sometimes they are sharp. Continues to cramp. Having heavy, watery ?lt green d/c.

## 2017-01-27 NOTE — MAU Note (Signed)
Patient noticed discharge starting on Saturday October27, 2018.  Patient is having cramping.

## 2017-01-27 NOTE — MAU Provider Note (Signed)
History     CSN: [id number]  Arrival date and time: 01/27/17 1700   First Provider Initiated Contact with Patient 01/27/17 1821      Chief Complaint  Patient presents with  . Vaginal Discharge  . Abdominal Cramping   HPI Patricia [name] is a 21 y.o. G2P1001 at [redacted]w[redacted]d by LMP who presents with abdominal pain & vaginal discharge. Symptoms began on Saturday. Reports watery brown discharge. Denies irritation or odor. Also noted some red spotting on toilet paper over the weekend. Intermittent lower abdominal cramping. Rates pain 5/10. Has not treated. Patient has not been sexually active in the last few weeks. Denies dysuria.  OB History    Gravida Para Term Preterm AB Living   2 1 1     1    SAB TAB Ectopic Multiple Live Births         0 1      Past Medical History:  Diagnosis Date  . Medical history non-contributory     Past Surgical History:  Procedure Laterality Date  . NO PAST SURGERIES      Family History  Problem Relation Age of Onset  . Miscarriages / [place] Mother   . Hyperlipidemia Father   . Diabetes Maternal Grandmother   . Hypertension Maternal Grandfather     Social History  Substance Use Topics  . Smoking status: Never Smoker  . Smokeless tobacco: Never Used  . Alcohol use No    Allergies: No Known Allergies  Prescriptions Prior to Admission  Medication Sig Dispense Refill Last Dose  . promethazine (PHENERGAN) 25 MG tablet Take 1 tablet (25 mg total) by mouth every 6 (six) hours as needed for nausea or vomiting. 30 tablet 5 01/27/2017 at Unknown time  . metoCLOPramide (REGLAN) 10 MG tablet Take 1 tablet (10 mg total) by mouth 3 (three) times daily before meals. 30 tablet 5 Unknown at Unknown time  . Prenatal Vit-Fe Fumarate-FA (PRENATAL MULTIVITAMIN) TABS tablet Take 1 tablet by mouth daily.    Unknown at Unknown time  . ranitidine (ZANTAC) 150 MG tablet Take 1 tablet (150 mg total) by mouth 2 (two) times daily. 60 tablet 5 More than a month at  Unknown time    Review of Systems  Constitutional: Negative.   Gastrointestinal: Positive for abdominal pain, nausea and vomiting. Negative for constipation and diarrhea.  Genitourinary: Positive for vaginal bleeding and vaginal discharge. Negative for dyspareunia, dysuria and vaginal pain.   Physical Exam   Blood pressure 133/83, pulse (!) 109, temperature 99.8 F (37.7 C), temperature source Oral, resp. rate 16, weight 148 lb (67.1 kg), last menstrual period 12/08/2016, SpO2 100 %, unknown if currently breastfeeding.  Physical Exam  Nursing note and vitals reviewed. Constitutional: She is oriented to person, place, and time. She appears well-developed and well-nourished. No distress.  HENT:  Head: Normocephalic and atraumatic.  Mouth/Throat: Mucous membranes are dry.  Eyes: Conjunctivae are normal. Right eye exhibits no discharge. Left eye exhibits no discharge. No scleral icterus.  Neck: Normal range of motion.  Respiratory: Effort normal. No respiratory distress.  GI: Soft. She exhibits no distension. There is no tenderness.  Genitourinary: Uterus normal. Cervix exhibits friability. Cervix exhibits no motion tenderness. Right adnexum displays no mass and no tenderness. Left adnexum displays no mass and no tenderness. No bleeding in the vagina. Vaginal discharge (moderate amount of tan watery discharge. Green clumpy discharge adherant to vaginal walls & cervix) found.  Genitourinary Comments: Cervix closed  Neurological: She is alert and oriented to  person, place, and time.  Skin: Skin is warm and dry. She is not diaphoretic.  Psychiatric: She has a normal mood and affect. Her behavior is normal. Judgment and thought content normal.    MAU Course  Procedures Results for orders placed or performed during the hospital encounter of 01/27/17 (from the past 24 hour(s))  Urinalysis, Routine w reflex microscopic     Status: Abnormal   Collection Time: 01/27/17  5:38 PM  Result Value  Ref Range   Color, Urine AMBER (A) YELLOW   APPearance CLOUDY (A) CLEAR   Specific Gravity, Urine 1.029 1.005 - 1.030   pH 6.0 5.0 - 8.0   Glucose, UA NEGATIVE NEGATIVE mg/dL   Hgb urine dipstick LARGE (A) NEGATIVE   Bilirubin Urine NEGATIVE NEGATIVE   Ketones, ur 80 (A) NEGATIVE mg/dL   Protein, ur >=161>=300 (A) NEGATIVE mg/dL   Nitrite NEGATIVE NEGATIVE   Leukocytes, UA MODERATE (A) NEGATIVE   RBC / HPF TOO NUMEROUS TO COUNT 0 - 5 RBC/hpf   WBC, UA TOO NUMEROUS TO COUNT 0 - 5 WBC/hpf   Bacteria, UA FEW (A) NONE SEEN   Squamous Epithelial / LPF TOO NUMEROUS TO COUNT (A) NONE SEEN   WBC Clumps PRESENT    Mucus PRESENT    Non Squamous Epithelial 0-5 (A) NONE SEEN  CBC     Status: Abnormal   Collection Time: 01/27/17  6:25 PM  Result Value Ref Range   WBC 13.6 (H) 4.0 - 10.5 K/uL   RBC 4.25 3.87 - 5.11 MIL/uL   Hemoglobin 13.4 12.0 - 15.0 g/dL   HCT 09.636.3 04.536.0 - 40.946.0 %   MCV 85.4 78.0 - 100.0 fL   MCH 31.5 26.0 - 34.0 pg   MCHC 36.9 (H) 30.0 - 36.0 g/dL   RDW 81.112.3 91.411.5 - 78.215.5 %   Platelets 275 150 - 400 K/uL  hCG, quantitative, pregnancy     Status: Abnormal   Collection Time: 01/27/17  6:25 PM  Result Value Ref Range   hCG, Beta Chain, Quant, S 112,330 (H) <5 mIU/mL  Wet prep, genital     Status: Abnormal   Collection Time: 01/27/17  6:45 PM  Result Value Ref Range   Yeast Wet Prep HPF POC NONE SEEN NONE SEEN   Trich, Wet Prep NONE SEEN NONE SEEN   Clue Cells Wet Prep HPF POC NONE SEEN NONE SEEN   WBC, Wet Prep HPF POC MANY (A) NONE SEEN   Sperm NONE SEEN     MDM +UPT UA, wet prep, GC/chlamydia, CBC, ABO/Rh, quant hCG, HIV, and US today to rule out ectopic pregnancy Will tx for yeast based on exam A positive  Care [facility] CNM    Judeth HornLawrence, Erin, NP 01/27/2017 8:08 PM   MDM: IUP on today's US c/w LMP dates.  Although wet prep negative, clinical evidence of yeast so will treat with Terazol 7. Discussed with pt and may use OTC Monistat 7 or  generic for cost savings.  Pt reports she is doing better with nausea/vomiting despite 80 ketones in UA today. She is keeping down fluids well so encouraged to increase PO fluids/food intake and continue medications. Pt had dystonia reaction to IV phenergan at last MAU visit but reports she has not had any negative reaction to PO meds at home. Encouraged pt to return to MAU if dystonic reaction occurs with meds at home so alternative therapy can be prescribed.  Pt states understanding.    Assessment  and Plan   1. Normal IUP (intrauterine pregnancy) on prenatal ultrasound, first trimester   2. Supervision of other normal pregnancy, antepartum   3. History of gestational hypertension   4. Vaginal bleeding in pregnancy, first trimester   5. Abdominal pain during pregnancy in first trimester   6. Vaginal candidiasis     D/C home with first trimester precautions F/U at [facility] as scheduled Nov 15 Return to MAU as needed for emergencies  Sharen Counter, CNM 9:01 PM

## 2017-01-29 LAB — GC/CHLAMYDIA PROBE AMP (~~LOC~~) NOT AT ARMC
Chlamydia: NEGATIVE
Neisseria Gonorrhea: NEGATIVE

## 2017-02-06 ENCOUNTER — Inpatient Hospital Stay (HOSPITAL_COMMUNITY)
Admission: AD | Admit: 2017-02-06 | Discharge: 2017-02-06 | Disposition: A | Discharge status: Home / Self Care | Payer: Self-pay | Source: Ambulatory Visit | Attending: Obstetrics and Gynecology | Admitting: Obstetrics and Gynecology

## 2017-02-06 ENCOUNTER — Encounter (HOSPITAL_COMMUNITY): Payer: Self-pay

## 2017-02-06 ENCOUNTER — Telehealth: Payer: Self-pay | Admitting: General Practice

## 2017-02-06 DIAGNOSIS — O211 Hyperemesis gravidarum with metabolic disturbance: Secondary | ICD-10-CM | POA: Insufficient documentation

## 2017-02-06 DIAGNOSIS — Z3A08 8 weeks gestation of pregnancy: Secondary | ICD-10-CM | POA: Insufficient documentation

## 2017-02-06 DIAGNOSIS — Z3480 Encounter for supervision of other normal pregnancy, unspecified trimester: Secondary | ICD-10-CM

## 2017-02-06 DIAGNOSIS — Z8759 Personal history of other complications of pregnancy, childbirth and the puerperium: Secondary | ICD-10-CM

## 2017-02-06 DIAGNOSIS — E860 Dehydration: Secondary | ICD-10-CM

## 2017-02-06 DIAGNOSIS — O210 Mild hyperemesis gravidarum: Secondary | ICD-10-CM

## 2017-02-06 HISTORY — DX: Vomiting, unspecified: R11.10

## 2017-02-06 LAB — COMPREHENSIVE METABOLIC PANEL
ALT: 26 U/L (ref 14–54)
ANION GAP: 17 — AB (ref 5–15)
AST: 26 U/L (ref 15–41)
Albumin: 4.5 g/dL (ref 3.5–5.0)
Alkaline Phosphatase: 65 U/L (ref 38–126)
BUN: 15 mg/dL (ref 6–20)
CHLORIDE: 100 mmol/L — AB (ref 101–111)
CO2: 18 mmol/L — ABNORMAL LOW (ref 22–32)
CREATININE: 0.88 mg/dL (ref 0.44–1.00)
Calcium: 9.9 mg/dL (ref 8.9–10.3)
Glucose, Bld: 80 mg/dL (ref 65–99)
POTASSIUM: 4 mmol/L (ref 3.5–5.1)
Sodium: 135 mmol/L (ref 135–145)
Total Bilirubin: 0.7 mg/dL (ref 0.3–1.2)
Total Protein: 8.6 g/dL — ABNORMAL HIGH (ref 6.5–8.1)

## 2017-02-06 LAB — URINALYSIS, ROUTINE W REFLEX MICROSCOPIC
Glucose, UA: NEGATIVE mg/dL
NITRITE: NEGATIVE
Protein, ur: 100 mg/dL — AB
Specific Gravity, Urine: >1.03 — ABNORMAL HIGH (ref 1.005–1.030)
pH: 6 (ref 5.0–8.0)

## 2017-02-06 LAB — CBC
HCT: 40 % (ref 36.0–46.0)
Hemoglobin: 14.4 g/dL (ref 12.0–15.0)
MCH: 30.6 pg (ref 26.0–34.0)
MCHC: 36 g/dL (ref 30.0–36.0)
MCV: 85.1 fL (ref 78.0–100.0)
PLATELETS: 350 10*3/uL (ref 150–400)
RBC: 4.7 MIL/uL (ref 3.87–5.11)
RDW: 12 % (ref 11.5–15.5)
WBC: 12.8 10*3/uL — ABNORMAL HIGH (ref 4.0–10.5)

## 2017-02-06 LAB — URINALYSIS, MICROSCOPIC (REFLEX)

## 2017-02-06 MED ORDER — LACTATED RINGERS IV BOLUS (SEPSIS)
1000.0000 mL | Freq: Once | INTRAVENOUS | Status: AC
  Administered 2017-02-06: 1000 mL via INTRAVENOUS

## 2017-02-06 MED ORDER — ONDANSETRON 4 MG PO TBDP: 4.0000 mg | ORAL_TABLET | Freq: Three times a day (TID) | ORAL | 0 refills | Status: DC | PRN

## 2017-02-06 MED ORDER — DEXTROSE 5 % IN LACTATED RINGERS IV BOLUS
1000.0000 mL | Freq: Once | INTRAVENOUS | Status: AC
  Administered 2017-02-06: 1000 mL via INTRAVENOUS

## 2017-02-06 MED ORDER — ONDANSETRON HCL 4 MG/2ML IJ SOLN
4.0000 mg | Freq: Once | INTRAMUSCULAR | Status: AC
  Administered 2017-02-06: 4 mg via INTRAVENOUS
  Filled 2017-02-06: qty 2

## 2017-02-06 NOTE — MAU Provider Note (Signed)
Chief Complaint: Emesis   First Provider Initiated Contact with Patient 02/06/17 2016      SUBJECTIVE HPI: Patricia [name] is a 21 y.o. G2P1001 at 1261w4d by LMP who presents to maternity admissions reporting worsening n/v of pregnancy in last 2 days. She cannot keep down any food or fluids with vomiting 6-10 times daily.  She is taking Phenergan and it was helping but has not worked x 2 days.  She also reports some mild reaction to Phenergan PO, similar to her reaction to IV Phenergan, in the last week.  She reports some twitching/shaking of her legs when taking the medication.  She never picked up the Reglan that was prescribed.  She reports a 22 lb weight loss during this pregnancy. She denies any associated symptoms.   She denies abdominal pain, vaginal bleeding, vaginal itching/burning, urinary symptoms, h/a, dizziness, or fever/chills.     HPI  Past Medical History:  Diagnosis Date  . Hyperemesis    Past Surgical History:  Procedure Laterality Date  . NO PAST SURGERIES     Social History   Socioeconomic History  . Marital status: Single    Spouse name: Not on file  . Number of children: Not on file  . Years of education: Not on file  . Highest education level: Not on file  Social Needs  . Financial resource strain: Not on file  . Food insecurity - worry: Not on file  . Food insecurity - inability: Not on file  . Transportation needs - medical: Not on file  . Transportation needs - non-medical: Not on file  Occupational History  . Not on file  Tobacco Use  . Smoking status: Never Smoker  . Smokeless tobacco: Never Used  Substance and Sexual Activity  . Alcohol use: No  . Drug use: No  . Sexual activity: Yes    Birth control/protection: None  Other Topics Concern  . Not on file  Social History Narrative  . Not on file   No current facility-administered medications on file prior to encounter.    Current Outpatient Medications on File Prior to Encounter  Medication  Sig Dispense Refill  . metoCLOPramide (REGLAN) 10 MG tablet Take 1 tablet (10 mg total) by mouth 3 (three) times daily before meals. 30 tablet 5  . Prenatal Vit-Fe Fumarate-FA (PRENATAL MULTIVITAMIN) TABS tablet Take 1 tablet by mouth daily.     . promethazine (PHENERGAN) 25 MG tablet Take 1 tablet (25 mg total) by mouth every 6 (six) hours as needed for nausea or vomiting. 30 tablet 5  . ranitidine (ZANTAC) 150 MG tablet Take 1 tablet (150 mg total) by mouth 2 (two) times daily. 60 tablet 5  . terconazole (TERAZOL 7) 0.4 % vaginal cream Place 1 applicator vaginally at bedtime. 45 g 0   No Known Allergies  ROS:  Review of Systems  Constitutional: Negative for chills, fatigue and fever.  Respiratory: Negative for shortness of breath.   Cardiovascular: Negative for chest pain.  Gastrointestinal: Positive for nausea and vomiting. Negative for constipation and diarrhea.  Genitourinary: Negative for difficulty urinating, dysuria, flank pain, pelvic pain, vaginal bleeding, vaginal discharge and vaginal pain.  Neurological: Negative for dizziness and headaches.  Psychiatric/Behavioral: Negative.      I have reviewed patient's Past Medical Hx, Surgical Hx, Family Hx, Social Hx, medications and allergies.   Physical Exam   Patient Vitals for the past 24 hrs:  BP Temp Pulse Resp Height Weight  02/06/17 1726 121/86 98.9 F (37.2 C)  100 18 4\' 11"  (1.499 m) 138 lb (62.6 kg)   Constitutional: Well-developed, well-nourished female in no acute distress.  Cardiovascular: normal rate Respiratory: normal effort GI: Abd soft, non-tender. Pos BS x 4 MS: Extremities nontender, no edema, normal ROM Neurologic: Alert and oriented x 4.  GU: Neg CVAT.   LAB RESULTS Results for orders placed or performed during the hospital encounter of 02/06/17 (from the past 24 hour(s))  Urinalysis, Routine w reflex microscopic     Status: Abnormal   Collection Time: 02/06/17  5:30 PM  Result Value Ref Range    Color, Urine YELLOW YELLOW   APPearance CLOUDY (A) CLEAR   Specific Gravity, Urine >1.030 (H) 1.005 - 1.030   pH 6.0 5.0 - 8.0   Glucose, UA NEGATIVE NEGATIVE mg/dL   Hgb urine dipstick TRACE (A) NEGATIVE   Bilirubin Urine SMALL (A) NEGATIVE   Ketones, ur >80 (A) NEGATIVE mg/dL   Protein, ur 161 (A) NEGATIVE mg/dL   Nitrite NEGATIVE NEGATIVE   Leukocytes, UA MODERATE (A) NEGATIVE  Urinalysis, Microscopic (reflex)     Status: Abnormal   Collection Time: 02/06/17  5:30 PM  Result Value Ref Range   RBC / HPF 0-5 0 - 5 RBC/hpf   WBC, UA 6-30 0 - 5 WBC/hpf   Bacteria, UA FEW (A) NONE SEEN   Squamous Epithelial / LPF 6-30 (A) NONE SEEN  CBC     Status: Abnormal   Collection Time: 02/06/17  6:50 PM  Result Value Ref Range   WBC 12.8 (H) 4.0 - 10.5 K/uL   RBC 4.70 3.87 - 5.11 MIL/uL   Hemoglobin 14.4 12.0 - 15.0 g/dL   HCT 09.6 04.5 - 40.9 %   MCV 85.1 78.0 - 100.0 fL   MCH 30.6 26.0 - 34.0 pg   MCHC 36.0 30.0 - 36.0 g/dL   RDW 81.1 91.4 - 78.2 %   Platelets 350 150 - 400 K/uL  Comprehensive metabolic panel     Status: Abnormal   Collection Time: 02/06/17  6:50 PM  Result Value Ref Range   Sodium 135 135 - 145 mmol/L   Potassium 4.0 3.5 - 5.1 mmol/L   Chloride 100 (L) 101 - 111 mmol/L   CO2 18 (L) 22 - 32 mmol/L   Glucose, Bld 80 65 - 99 mg/dL   BUN 15 6 - 20 mg/dL   Creatinine, Ser 9.56 0.44 - 1.00 mg/dL   Calcium 9.9 8.9 - 21.3 mg/dL   Total Protein 8.6 (H) 6.5 - 8.1 g/dL   Albumin 4.5 3.5 - 5.0 g/dL   AST 26 15 - 41 U/L   ALT 26 14 - 54 U/L   Alkaline Phosphatase 65 38 - 126 U/L   Total Bilirubin 0.7 0.3 - 1.2 mg/dL   GFR calc non Af Amer >60 >60 mL/min   GFR calc Af Amer >60 >60 mL/min   Anion gap 17 (H) 5 - 15       IMAGING US Ob Comp Less 14 Wks  Result Date: 01/27/2017 CLINICAL DATA:  Cramping and vaginal spotting. Positive pregnancy test. EXAM: OBSTETRIC <14 WK [place] AND TRANSVAGINAL OB US TECHNIQUE: Both transabdominal and transvaginal ultrasound examinations  were performed for complete evaluation of the gestation as well as the maternal uterus, adnexal regions, and pelvic cul-de-sac. Transvaginal technique was performed to assess early pregnancy. COMPARISON:  None. FINDINGS: Intrauterine gestational sac: Single. Yolk sac:  Visualized. Embryo:  Visualized. Cardiac Activity: Visualized. Heart Rate: 149  bpm CRL:  7.1  mm   6 w   4 d                  US EDC: 09/18/2017 Subchorionic hemorrhage:  None visualized. Maternal uterus/adnexae: Unremarkable. IMPRESSION: Single living intrauterine gestation at estimated 6 week 4 day gestational age by crown-rump length. Electronically Signed   By: Kennith CenterEric  Mansell M.D.   On: 01/27/2017 20:20    MAU Management/MDM: Ordered labs and reviewed results.  UA c/w dehydration.  CBC, CMP, done and wnl for pregnancy. Consult Dr Earlene Plateravis with assessment and findings. With significant weight loss and pt negative reaction to Phenergan/Reglan, consider offering pt Zofran.  Discussed with the patient the risk of Zofran use in the first trimester of pregnancy. Risks of use in the first trimester include birth defects in babies, specifically cleft lip/palate.  Pt states understanding and plans to use Zofran sparingly.  Zofran 4 mg IV dose given today in MAU. Pt tolerated PO food/fluids. D/C home with plan to use Doxylamine/B6 daily and Zofran 4 mg ODT Q 8 hours PRN.  Pt to keep scheduled appt with [facility] on 11/15 Pt discharged with strict first trimester precautions.  ASSESSMENT 1. [redacted] weeks gestation of pregnancy   2. Supervision of other normal pregnancy, antepartum   3. History of gestational hypertension   4. Hyperemesis gravidarum   5. Dehydration     PLAN Discharge home  Allergies as of 02/06/2017   No Known Allergies     Medication List    STOP taking these medications   metoCLOPramide 10 MG tablet Commonly known as:  REGLAN   promethazine 25 MG tablet Commonly known as:  PHENERGAN   terconazole 0.4 % vaginal  cream Commonly known as:  TERAZOL 7     TAKE these medications   ondansetron 4 MG disintegrating tablet Commonly known as:  ZOFRAN ODT Take 1 tablet (4 mg total) every 8 (eight) hours as needed by mouth for nausea or vomiting.   prenatal multivitamin Tabs tablet Take 1 tablet by mouth daily.   ranitidine 150 MG tablet Commonly known as:  ZANTAC Take 1 tablet (150 mg total) by mouth 2 (two) times daily.      Follow-up Information    [facility]. Schedule an appointment as soon as possible for a visit in 1 week(s).   Contact information: [address] [place] [address] [phone]           Sharen CounterLisa Leftwich-Kirby Certified Nurse-Midwife 02/06/2017  9:11 PM

## 2017-02-06 NOTE — MAU Note (Signed)
Pt reports w she had been havign n/v for about a month. Has been taking promethazine but it stopped working since Monday. Stated her emesis was dark red that last few times. Throat feel scratchy and raw. 10lb WEIGHT LOSS FROM LAST WEEK NOTED TODAY.

## 2017-02-06 NOTE — Telephone Encounter (Signed)
Attempted to call patient to come in for BP check.  Left VM on cell phone for patient to give our office a call.  Unable to reach patient on home # and unable to leave message for patient.

## 2017-02-06 NOTE — Discharge Instructions (Signed)
Nausea medication to take during pregnancy:   Unisom (doxylamine succinate 25 mg tablets) Take one tablet daily at bedtime. If symptoms are not adequately controlled, the dose can be increased to a maximum recommended dose of two tablets daily (1/2 tablet in the morning, 1/2 tablet mid-afternoon and one at bedtime).  Vitamin B6 100mg  tablets. Take one tablet twice a day (up to 200 mg per day).  Add Zofran as prescribed to take as needed.     Medicamentos Nusea para tomar durante el embarazo:  Unisom (succinato de doxilamina 25 mg comprimidos) Tome una tableta al da al acostarse. Si los sntomas no estn adecuadamente controlados, la dosis puede aumentarse hasta una dosis mxima recomendada de Woodssidedos comprimidos al da (medio comprimido por la maana, media tableta a media tarde y otro antes de Macdoelacostarse).  Tabletas de 100 mg de vitamina B6. Tome una Hallibur[employer] Companytableta dos veces al da (hasta 200 mg por da).   Eating Plan for Hyperemesis Gravidarum Hyperemesis gravidarum is a severe form of morning sickness. Because this condition causes severe nausea and vomiting, it can lead to dehydration, malnutrition, and weight loss. One way to lessen the symptoms of nausea and vomiting is to follow the eating plan for hyperemesis gravidarum. It is often used along with prescribed medicines to control your symptoms. What can I do to relieve my symptoms? Listen to your body. Everyone is different and has different preferences. Find what works best for you. Take any of the following actions that are helpful to you:  Eat and drink slowly.  Eat 5-6 small meals daily instead of 3 large meals.  Eat crackers before you get out of bed in the morning.  Try having a snack in the middle of the night.  Starchy foods are usually tolerated well. Examples include cereal, toast, bread, potatoes, pasta, rice, and pretzels.  Ginger may help with nausea. Add  tsp ground ginger to hot tea or choose ginger tea.  Try drinking  100% fruit juice or an electrolyte drink. An electrolyte drink contains sodium, potassium, and chloride.  Continue to take your prenatal vitamins as told by your health care provider. If you are having trouble taking your prenatal vitamins, talk with your health care provider about different options.  Include at least 1 serving of protein with your meals and snacks. Protein options include meats or poultry, beans, nuts, eggs, and yogurt. Try eating a protein-rich snack before bed. Examples of these snacks include cheese and crackers or half of a peanut butter or [place]urkey sandwich.  Consider eliminating foods that trigger your symptoms. These may include spicy foods, coffee, high-fat foods, very sweet foods, and acidic foods.  Try meals that have more protein combined with bland, salty, lower-fat, and dry foods, such as nuts, seeds, pretzels, crackers, and cereal.  Talk with your healthcare provider about starting a supplement of vitamin B6.  Have fluids that are cold, clear, and carbonated or sour. Examples include lemonade, ginger ale, lemon-lime soda, ice water, and sparkling water.  Try lemon or mint tea.  Try brushing your teeth or using a mouth rinse after meals.  What should I avoid to reduce my symptoms? Avoiding some of the following things may help reduce your symptoms.  Foods with strong smells. Try eating meals in well-ventilated areas that are free of odors.  Drinking water or other beverages with meals. Try not to drink anything during the 30 minutes before and after your meals.  Drinking more than 1 cup of fluid at a  time. Sometimes using a straw helps.  Fried or high-fat foods, such as butter and cream sauces.  Spicy foods.  Skipping meals as best as you can. Nausea can be more intense on an empty stomach. If you cannot tolerate food at that time, do not force it. Try sucking on ice chips or other frozen items, and make up for missed calories later.  Lying down within 2  hours after eating.  Environmental triggers. These may include smoky rooms, closed spaces, rooms with strong smells, warm or humid places, overly loud and noisy rooms, and rooms with motion or flickering lights.  Quick and sudden changes in your movement.  This information is not intended to replace advice given to you by your health care provider. Make sure you discuss any questions you have with your health care provider. Document Released: 01/13/2007 Document Revised: 11/15/2015 Document Reviewed: 10/17/2015 Elsevier Interactive Patient Education  2018 [employer].  Hyperemesis Gravidarum Hyperemesis gravidarum is a severe form of nausea and vomiting that happens during pregnancy. Hyperemesis is worse than morning sickness. It may cause you to have nausea or vomiting all day for many days. It may keep you from eating and drinking enough food and liquids. Hyperemesis usually occurs during the first half (the first 20 weeks) of pregnancy. It often goes away once a woman is in her second half of pregnancy. However, sometimes hyperemesis continues through an entire pregnancy. What are the causes? The cause of this condition is not known. It may be related to changes in chemicals (hormones) in the body during pregnancy, such as the high level of pregnancy hormone (human chorionic gonadotropin) or the increase in the female sex hormone (estrogen). What are the signs or symptoms? Symptoms of this condition include:  Severe nausea and vomiting.  Nausea that does not go away.  Vomiting that does not allow you to keep any food down.  Weight loss.  Body fluid loss (dehydration).  Having no desire to eat, or not liking food that you have previously enjoyed.  How is this diagnosed? This condition may be diagnosed based on:  A physical exam.  Your medical history.  Your symptoms.  Blood tests.  Urine tests.  How is this treated? This condition may be managed with medicine. If  medicines to do not help relieve nausea and vomiting, you may need to receive fluids through an IV tube at the hospital. Follow these instructions at home:  Take over-the-counter and prescription medicines only as told by your health care provider.  Avoid iron pills and multivitamins that contain iron for the first 3-4 months of pregnancy. If you take prescription iron pills, do not stop taking them unless your health care provider approves.  Take the following actions to help prevent nausea and vomiting: ? In the morning, before getting out of bed, try eating a couple of dry crackers or a piece of toast. ? Avoid foods and smells that upset your stomach. Fatty and spicy foods may make nausea worse. ? Eat 5-6 small meals a day. ? Do not drink fluids while eating meals. Drink between meals. ? Eat or suck on things that have ginger in them. Ginger can help relieve nausea. ? Avoid food preparation. The smell of food can spoil your appetite or trigger nausea.  Follow instructions from your health care provider about eating or drinking restrictions.  For snacks, eat high-protein foods, such as cheese.  Keep all follow-up and pre-birth (prenatal) visits as told by your health care provider. This  is important. Contact a health care provider if:  You have pain in your abdomen.  You have a severe headache.  You have vision problems.  You are losing weight. Get help right away if:  You cannot drink fluids without vomiting.  You vomit blood.  You have constant nausea and vomiting.  You are very weak.  You are very thirsty.  You feel dizzy.  You faint.  You have a fever or other symptoms that last for more than 2-3 days.  You have a fever and your symptoms suddenly get worse. Summary  Hyperemesis gravidarum is a severe form of nausea and vomiting that happens during pregnancy.  Making some changes to your eating habits may help relieve nausea and vomiting.  This condition may  be managed with medicine.  If medicines to do not help relieve nausea and vomiting, you may need to receive fluids through an IV tube at the hospital. This information is not intended to replace advice given to you by your health care provider. Make sure you discuss any questions you have with your health care provider. Document Released: 03/18/2005 Document Revised: 11/15/2015 Document Reviewed: 11/15/2015 Elsevier Interactive Patient Education  2017 [employer]

## 2017-02-10 ENCOUNTER — Encounter (HOSPITAL_COMMUNITY): Payer: Self-pay | Admitting: *Deleted

## 2017-02-10 ENCOUNTER — Inpatient Hospital Stay (HOSPITAL_COMMUNITY)
Admission: AD | Admit: 2017-02-10 | Discharge: 2017-02-10 | Disposition: A | Discharge status: Home / Self Care | Payer: Self-pay | Source: Ambulatory Visit | Attending: Family Medicine | Admitting: Family Medicine

## 2017-02-10 DIAGNOSIS — O219 Vomiting of pregnancy, unspecified: Secondary | ICD-10-CM

## 2017-02-10 DIAGNOSIS — Z3A09 9 weeks gestation of pregnancy: Secondary | ICD-10-CM | POA: Insufficient documentation

## 2017-02-10 DIAGNOSIS — O210 Mild hyperemesis gravidarum: Secondary | ICD-10-CM | POA: Insufficient documentation

## 2017-02-10 LAB — URINALYSIS, ROUTINE W REFLEX MICROSCOPIC
BACTERIA UA: NONE SEEN
Bilirubin Urine: NEGATIVE
Glucose, UA: NEGATIVE mg/dL
Hgb urine dipstick: NEGATIVE
KETONES UR: 80 mg/dL — AB
NITRITE: NEGATIVE
PROTEIN: 100 mg/dL — AB
Specific Gravity, Urine: 1.03 (ref 1.005–1.030)
pH: 5 (ref 5.0–8.0)

## 2017-02-10 MED ORDER — VITAFOL GUMMIES 3.33-0.333-34.8 MG PO CHEW: 2.0000 | CHEWABLE_TABLET | Freq: Every day | ORAL | 6 refills | Status: AC

## 2017-02-10 MED ORDER — DOXYLAMINE-PYRIDOXINE 10-10 MG PO TBEC: DELAYED_RELEASE_TABLET | ORAL | 6 refills | Status: DC

## 2017-02-10 MED ORDER — SODIUM CHLORIDE 0.9 % IV SOLN
25.0000 mg | Freq: Once | INTRAVENOUS | Status: DC
  Filled 2017-02-10: qty 1

## 2017-02-10 MED ORDER — ONDANSETRON 8 MG PO TBDP
8.0000 mg | ORAL_TABLET | Freq: Once | ORAL | Status: AC
  Administered 2017-02-10: 8 mg via ORAL
  Filled 2017-02-10: qty 1

## 2017-02-10 NOTE — MAU Provider Note (Signed)
  History     CSN: [id number]  Arrival date and time: 02/10/17 1413   None     Chief Complaint  Patient presents with  . Emesis  . Chest Pain   HPI 21 yo G2P1 at 5814w1d here for persistent nausea/emesis in pregnancy. Patient reports unchanged symptoms since her last MAU visit on 11/8. She stopped taking phenergan due to extrapyramidal effects. She was prescribed zofran but has not taken it as she is in the first trimester. Patient reports 10 episodes of emesis today with poor po intake. Patient denies any cramping or vaginal bleeding. [facility] (next [facility] scheduled on 02/13/2017)  OB History    Gravida Para Term Preterm AB Living   2 1 1     1    SAB TAB Ectopic Multiple Live Births         0 1      Past Medical History:  Diagnosis Date  . Hyperemesis   . Hypertension     Past Surgical History:  Procedure Laterality Date  . NO PAST SURGERIES      Family History  Problem Relation Age of Onset  . Miscarriages / [place] Mother   . Hyperlipidemia Father   . Diabetes Maternal Grandmother   . Hypertension Maternal Grandfather     Social History   Tobacco Use  . Smoking status: Never Smoker  . Smokeless tobacco: Never Used  Substance Use Topics  . Alcohol use: No  . Drug use: No    Allergies:  Allergies  Allergen Reactions  . Phenergan [Promethazine Hcl] Other (See Comments)    Leg spasms, extrapyramidal effects  . Reglan [Metoclopramide]     Extrapyramidal effects      Medications Prior to Admission  Medication Sig Dispense Refill Last Dose  . ondansetron (ZOFRAN ODT) 4 MG disintegrating tablet Take 1 tablet (4 mg total) every 8 (eight) hours as needed by mouth for nausea or vomiting. 30 tablet 0 Past Week at Unknown time  . Prenatal Vit-Fe Fumarate-FA (PRENATAL MULTIVITAMIN) TABS tablet Take 1 tablet by mouth daily.    Past Month at Unknown time  . ranitidine (ZANTAC) 150 MG tablet Take 1 tablet (150 mg  total) by mouth 2 (two) times daily. 60 tablet 5 Unknown at Unknown time    Review of Systems  See pertinent in HPI Physical Exam   Blood pressure 132/85, pulse (!) 105, temperature 98.2 F (36.8 C), temperature source Oral, resp. rate 16, weight 135 lb 1.3 oz (61.3 kg), last menstrual period 12/08/2016, SpO2 100 %, unknown if currently breastfeeding.  Physical Exam GENERAL: Well-developed, well-nourished female in no acute distress.  ABDOMEN: Soft, nontender, nondistended. No organomegaly. PELVIC: Not indicated EXTREMITIES: No cyanosis, clubbing, or edema, 2+ distal pulses.  MAU Course  Procedures  MDM Zofran ODT Po hydration Patient tolerated liquids and crackers Assessment and Plan  21 yo G2P1 at 5814w1d with nausea/vomiting in pregnancy - Patient reports improvement in her symptoms following oral zofran - Patient advised to continue taking zofran prn - Rx diclegis provided - Patient to follow up as scheduled on 11/15 for prenatal care - Precautions reviewed - RTC prn  Patricia [name] 02/10/2017, 9:47 PM

## 2017-02-10 NOTE — Discharge Instructions (Signed)
Morning Sickness °Morning sickness is when you feel sick to your stomach (nauseous) during pregnancy. This nauseous feeling may or may not come with vomiting. It often occurs in the morning but can be a problem any time of day. Morning sickness is most common during the first trimester, but it may continue throughout pregnancy. While morning sickness is unpleasant, it is usually harmless unless you develop severe and continual vomiting (hyperemesis gravidarum). This condition requires more intense treatment. °What are the causes? °The cause of morning sickness is not completely known but seems to be related to normal hormonal changes that occur in pregnancy. °What increases the risk? °You are at greater risk if you: °· Experienced nausea or vomiting before your pregnancy. °· Had morning sickness during a previous pregnancy. °· Are pregnant with more than one baby, such as twins. ° °How is this treated? °Do not use any medicines (prescription, over-the-counter, or herbal) for morning sickness without first talking to your health care provider. Your health care provider may prescribe or recommend: °· Vitamin B6 supplements. °· Anti-nausea medicines. °· The herbal medicine ginger. ° °Follow these instructions at home: °· Only take over-the-counter or prescription medicines as directed by your health care provider. °· Taking multivitamins before getting pregnant can prevent or decrease the severity of morning sickness in most women. °· Eat a piece of dry toast or unsalted crackers before getting out of bed in the morning. °· Eat five or six small meals a day. °· Eat dry and bland foods (rice, baked potato). Foods high in carbohydrates are often helpful. °· Do not drink liquids with your meals. Drink liquids between meals. °· Avoid greasy, fatty, and spicy foods. °· Get someone to cook for you if the smell of any food causes nausea and vomiting. °· If you feel nauseous after taking prenatal vitamins, take the vitamins at  night or with a snack. °· Snack on protein foods (nuts, yogurt, cheese) between meals if you are hungry. °· Eat unsweetened gelatins for desserts. °· Wearing an acupressure wristband (worn for sea sickness) may be helpful. °· Acupuncture may be helpful. °· Do not smoke. °· Get a humidifier to keep the air in your house free of odors. °· Get plenty of fresh air. °Contact a health care provider if: °· Your home remedies are not working, and you need medicine. °· You feel dizzy or lightheaded. °· You are losing weight. °Get help right away if: °· You have persistent and uncontrolled nausea and vomiting. °· You pass out (faint). °This information is not intended to replace advice given to you by your health care provider. Make sure you discuss any questions you have with your health care provider. °Document Released: 05/09/2006 Document Revised: 08/24/2015 Document Reviewed: 09/02/2012 °Elsevier Interactive Patient Education © 2017 Elsevier Inc. ° °

## 2017-02-10 NOTE — MAU Note (Signed)
+  N/V Emesis greater than 10 times in the past 24 hours  +chest pain  Mid  Started yesterday Constant Burning in nature Worse with vomiting Rating pain 9/10

## 2017-03-01 ENCOUNTER — Other Ambulatory Visit: Payer: Self-pay

## 2017-03-01 ENCOUNTER — Inpatient Hospital Stay (HOSPITAL_COMMUNITY)
Admission: AD | Admit: 2017-03-01 | Discharge: 2017-03-01 | Disposition: A | Discharge status: Home / Self Care | Payer: Self-pay | Source: Ambulatory Visit | Attending: Obstetrics and Gynecology | Admitting: Obstetrics and Gynecology

## 2017-03-01 DIAGNOSIS — Z3A00 Weeks of gestation of pregnancy not specified: Secondary | ICD-10-CM | POA: Insufficient documentation

## 2017-03-01 DIAGNOSIS — O219 Vomiting of pregnancy, unspecified: Secondary | ICD-10-CM

## 2017-03-01 DIAGNOSIS — Z8759 Personal history of other complications of pregnancy, childbirth and the puerperium: Secondary | ICD-10-CM

## 2017-03-01 DIAGNOSIS — Z760 Encounter for issue of repeat prescription: Secondary | ICD-10-CM | POA: Insufficient documentation

## 2017-03-01 DIAGNOSIS — O210 Mild hyperemesis gravidarum: Secondary | ICD-10-CM | POA: Insufficient documentation

## 2017-03-01 DIAGNOSIS — Z3480 Encounter for supervision of other normal pregnancy, unspecified trimester: Secondary | ICD-10-CM

## 2017-03-01 MED ORDER — ONDANSETRON HCL 4 MG PO TABS: 4.0000 mg | ORAL_TABLET | Freq: Two times a day (BID) | ORAL | 3 refills | Status: DC

## 2017-03-01 NOTE — MAU Provider Note (Signed)
S: In requesting refill on zofran. States she ran out. States was working well for her nausea. O; vss, FHR st and reg with doppler. A: VSS, nausea and vomiting of preg P; D/C home. Rx for zofran

## 2017-03-01 NOTE — MAU Note (Signed)
[facility]. Wants a refill for Zofran that got here - ran out on Thursday and was told would have to come back here. Today, couldn't keep anything down.  No bleeding.

## 2017-04-01 NOTE — L&D Delivery Note (Signed)
Delivery Note Patricia HesselbachMaria is a 22 yo G2 now P2002 s/[name] SVD at 2213w5d after presenting in spontaneous labor. Labor was augmented with AROM with meconium stained fluid at 22:10.   At 12:46 AM a viable female was delivered via Vaginal, Spontaneous (Presentation: direct OA; head restituted with occiput to maternal left).  APGAR: 9, 9; weight  pending.   Immediately after delivery attention was turned to active management of the third stage of labor with gentle cord traction via Brandt maneuver. Placenta status: delivered spontaneously, intact, 3-vessel cord.    Anesthesia:  epidural Episiotomy: None Lacerations: 1st degree right-sided Perineal, hemostatic and did not require repair Suture Repair: n/a Est. Blood Loss (mL): 150  Mom to postpartum.  Baby to Couplet care / Skin to Skin.  Patricia [name] [name] 09/12/2017, 1:05 AM

## 2017-04-14 LAB — OB RESULTS CONSOLE GC/CHLAMYDIA
Chlamydia: NEGATIVE
Gonorrhea: NEGATIVE

## 2017-04-14 LAB — OB RESULTS CONSOLE RPR: RPR: NONREACTIVE

## 2017-04-14 LAB — OB RESULTS CONSOLE HIV ANTIBODY (ROUTINE TESTING): HIV: NONREACTIVE

## 2017-04-14 LAB — OB RESULTS CONSOLE HEPATITIS B SURFACE ANTIGEN: HEP B S AG: NEGATIVE

## 2017-04-14 LAB — OB RESULTS CONSOLE ANTIBODY SCREEN: ANTIBODY SCREEN: NEGATIVE

## 2017-04-14 LAB — OB RESULTS CONSOLE RUBELLA ANTIBODY, IGM: Rubella: IMMUNE

## 2017-04-14 LAB — OB RESULTS CONSOLE ABO/RH: RH TYPE: POSITIVE

## 2017-08-20 LAB — OB RESULTS CONSOLE GBS: STREP GROUP B AG: POSITIVE

## 2017-09-01 ENCOUNTER — Inpatient Hospital Stay (HOSPITAL_COMMUNITY)
Admission: AD | Admit: 2017-09-01 | Discharge: 2017-09-01 | Disposition: A | Discharge status: Home / Self Care | Payer: Medicaid Other | Source: Ambulatory Visit | Attending: Obstetrics and Gynecology | Admitting: Obstetrics and Gynecology

## 2017-09-01 ENCOUNTER — Encounter (HOSPITAL_COMMUNITY): Payer: Self-pay | Admitting: *Deleted

## 2017-09-01 DIAGNOSIS — R51 Headache: Secondary | ICD-10-CM | POA: Insufficient documentation

## 2017-09-01 DIAGNOSIS — Z79899 Other long term (current) drug therapy: Secondary | ICD-10-CM | POA: Diagnosis not present

## 2017-09-01 DIAGNOSIS — Z3A38 38 weeks gestation of pregnancy: Secondary | ICD-10-CM | POA: Insufficient documentation

## 2017-09-01 DIAGNOSIS — R519 Headache, unspecified: Secondary | ICD-10-CM

## 2017-09-01 DIAGNOSIS — O26893 Other specified pregnancy related conditions, third trimester: Secondary | ICD-10-CM | POA: Insufficient documentation

## 2017-09-01 DIAGNOSIS — Z3689 Encounter for other specified antenatal screening: Secondary | ICD-10-CM

## 2017-09-01 HISTORY — DX: Gestational (pregnancy-induced) hypertension without significant proteinuria, unspecified trimester: O13.9

## 2017-09-01 LAB — URINALYSIS, ROUTINE W REFLEX MICROSCOPIC
Bilirubin Urine: NEGATIVE
GLUCOSE, UA: NEGATIVE mg/dL
Hgb urine dipstick: NEGATIVE
Ketones, ur: NEGATIVE mg/dL
Nitrite: NEGATIVE
PH: 7 (ref 5.0–8.0)
Protein, ur: NEGATIVE mg/dL
Specific Gravity, Urine: 1.01 (ref 1.005–1.030)

## 2017-09-01 NOTE — MAU Provider Note (Signed)
History     CSN: [id number]  Arrival date and time: 09/01/17 4098   First Provider Initiated Contact with Patient 09/01/17 1816      Chief Complaint  Patient presents with  . Headache  . visual changes   HPI  Patricia [name] is a 22 y.o. G2P1001 at [redacted]w[redacted]d who presents with headaches and blurred vision. Gets prenatal care at [facility]. Pt concerned due to hx of gestational hypertension in previous pregnancy. Has had daily headaches for the last week. Headaches occur in the morning and resolve on their own. Has not treated headache. Currently does not have headache. Has felt like she's had occasional blurred vision for the last few days. Feels like when she's looking at something she has to close her eyes and reopen them to see clearly. Does not wear contacts or glasses. Denies epigastric pain. Positive fetal movement. Has next OB appt tomorrow morning.   OB History    Gravida  2   Para  1   Term  1   Preterm      AB      Living  1     SAB      TAB      Ectopic      Multiple  0   Live Births  1           Past Medical History:  Diagnosis Date  . Hyperemesis   . Pregnancy induced hypertension 2016   GHTN    Past Surgical History:  Procedure Laterality Date  . NO PAST SURGERIES      Family History  Problem Relation Age of Onset  . Miscarriages / [place] Mother   . Hyperlipidemia Father   . Diabetes Maternal Grandmother   . Hypertension Maternal Grandfather     Social History   Tobacco Use  . Smoking status: Never Smoker  . Smokeless tobacco: Never Used  Substance Use Topics  . Alcohol use: No  . Drug use: No    Allergies:  Allergies  Allergen Reactions  . Phenergan [Promethazine Hcl] Other (See Comments)    Leg spasms, extrapyramidal effects  . Reglan [Metoclopramide]     Extrapyramidal effects      Medications Prior to Admission  Medication Sig Dispense Refill Last Dose  . Doxylamine-Pyridoxine 10-10 MG TBEC Take 2 tabs at bedtime. If  symptoms persist after 2 days, take 1 tab in the morning and 2 tabs at bedtime 100 tablet 6   . ondansetron (ZOFRAN ODT) 4 MG disintegrating tablet Take 1 tablet (4 mg total) every 8 (eight) hours as needed by mouth for nausea or vomiting. 30 tablet 0 Past Week at Unknown time  . ondansetron (ZOFRAN) 4 MG tablet Take 1 tablet (4 mg total) by mouth 2 (two) times daily. 40 tablet 3   . Prenatal Vit-Fe Fumarate-FA (PRENATAL MULTIVITAMIN) TABS tablet Take 1 tablet by mouth daily.    Past Month at Unknown time  . ranitidine (ZANTAC) 150 MG tablet Take 1 tablet (150 mg total) by mouth 2 (two) times daily. 60 tablet 5 Unknown at Unknown time    Review of Systems  Constitutional: Negative.   Eyes: Positive for visual disturbance.  Respiratory: Negative for shortness of breath.   Cardiovascular: Negative for chest pain.  Gastrointestinal: Negative.   Genitourinary: Negative.   Neurological: Positive for headaches (none currently).   Physical Exam   Blood pressure 111/65, pulse 98, temperature 98.6 F (37 C), temperature source Oral, resp. rate 18, height 4\' 10"  (1.473  m), weight 182 lb (82.6 kg), last menstrual period 12/08/2016, unknown if currently breastfeeding.  Physical Exam  Nursing note and vitals reviewed. Constitutional: She is oriented to person, place, and time. She appears well-developed and well-nourished. No distress.  HENT:  Head: Normocephalic and atraumatic.  Eyes: Conjunctivae are normal. Right eye exhibits no discharge. Left eye exhibits no discharge. No scleral icterus.  Neck: Normal range of motion.  Cardiovascular: Normal rate, regular rhythm and normal heart sounds.  No murmur heard. Respiratory: Effort normal and breath sounds normal. No respiratory distress. She has no wheezes.  GI: Soft. There is no tenderness.  Neurological: She is alert and oriented to person, place, and time. She has normal reflexes.  No clonus  Skin: Skin is warm and dry. She is not diaphoretic.   Psychiatric: She has a normal mood and affect. Her behavior is normal. Judgment and thought content normal.    MAU Course  Procedures Results for orders placed or performed during the hospital encounter of 09/01/17 (from the past 24 hour(s))  Urinalysis, Routine w reflex microscopic     Status: Abnormal   Collection Time: 09/01/17  5:42 PM  Result Value Ref Range   Color, Urine AMBER (A) YELLOW   APPearance HAZY (A) CLEAR   Specific Gravity, Urine 1.010 1.005 - 1.030   pH 7.0 5.0 - 8.0   Glucose, UA NEGATIVE NEGATIVE mg/dL   Hgb urine dipstick NEGATIVE NEGATIVE   Bilirubin Urine NEGATIVE NEGATIVE   Ketones, ur NEGATIVE NEGATIVE mg/dL   Protein, ur NEGATIVE NEGATIVE mg/dL   Nitrite NEGATIVE NEGATIVE   Leukocytes, UA TRACE (A) NEGATIVE   RBC / HPF 0-5 0 - 5 RBC/hpf   WBC, UA 0-5 0 - 5 WBC/hpf   Bacteria, UA FEW (A) NONE SEEN   Squamous Epithelial / LPF 11-20 0 - 5   Mucus PRESENT    Amorphous Crystal PRESENT     MDM NST:  Baseline: 140 bpm, Variability: Good {> 6 bpm), Accelerations: Reactive and Decelerations: Absent  Pt normotensive during MAU visit. Patient asymptomatic today. No protein in urine.   Assessment and Plan  A: 1. Headache in pregnancy, antepartum, third trimester   2. [redacted] weeks gestation of pregnancy   3. NST (non-stress test) reactive    P: Discharge home Discussed reasons to return to MAU Keep OB appt tomorrow morning  Judeth Hornrin Fatin Bachicha 09/01/2017, 6:16 PM

## 2017-09-01 NOTE — Discharge Instructions (Signed)
Hypertension During Pregnancy °Hypertension, commonly called high blood pressure, is when the force of blood pumping through your arteries is too strong. Arteries are blood vessels that carry blood from the heart throughout the body. Hypertension during pregnancy can cause problems for you and your baby. Your baby may be born early (prematurely) or may not weigh as much as he or she should at birth. Very bad cases of hypertension during pregnancy can be life-threatening. °Different types of hypertension can occur during pregnancy. These include: °· Chronic hypertension. This happens when: °? You have hypertension before pregnancy and it continues during pregnancy. °? You develop hypertension before you are [redacted] weeks pregnant, and it continues during pregnancy. °· Gestational hypertension. This is hypertension that develops after the 20th week of pregnancy. °· Preeclampsia, also called toxemia of pregnancy. This is a very serious type of hypertension that develops only during pregnancy. It affects the whole body, and it can be very dangerous for you and your baby. ° °Gestational hypertension and preeclampsia usually go away within 6 weeks after your baby is born. Women who have hypertension during pregnancy have a greater chance of developing hypertension later in life or during future pregnancies. °What are the causes? °The exact cause of hypertension is not known. °What increases the risk? °There are certain factors that make it more likely for you to develop hypertension during pregnancy. These include: °· Having hypertension during a previous pregnancy or prior to pregnancy. °· Being overweight. °· Being older than age 40. °· Being pregnant for the first time or being pregnant with more than one baby. °· Becoming pregnant using fertilization methods such as IVF (in vitro fertilization). °· Having diabetes, kidney problems, or systemic lupus erythematosus. °· Having a family history of hypertension. ° °What are the  signs or symptoms? °Chronic hypertension and gestational hypertension rarely cause symptoms. Preeclampsia causes symptoms, which may include: °· Increased protein in your urine. Your health care provider will check for this at every visit before you give birth (prenatal visit). °· Severe headaches. °· Sudden weight gain. °· Swelling of the hands, face, legs, and feet. °· Nausea and vomiting. °· Vision problems, such as blurred or double vision. °· Numbness in the face, arms, legs, and feet. °· Dizziness. °· Slurred speech. °· Sensitivity to bright lights. °· Abdominal pain. °· Convulsions. ° °How is this diagnosed? °You may be diagnosed with hypertension during a routine prenatal exam. At each prenatal visit, you may: °· Have a urine test to check for high amounts of protein in your urine. °· Have your blood pressure checked. A blood pressure reading is recorded as two numbers, such as "120 over 80" (or 120/80). The first ("top") number is called the systolic pressure. It is a measure of the pressure in your arteries when your heart beats. The second ("bottom") number is called the diastolic pressure. It is a measure of the pressure in your arteries as your heart relaxes between beats. Blood pressure is measured in a unit called mm Hg. A normal blood pressure reading is: °? Systolic: below 120. °? Diastolic: below 80. ° °The type of hypertension that you are diagnosed with depends on your test results and when your symptoms developed. °· Chronic hypertension is usually diagnosed before 20 weeks of pregnancy. °· Gestational hypertension is usually diagnosed after 20 weeks of pregnancy. °· Hypertension with high amounts of protein in the urine is diagnosed as preeclampsia. °· Blood pressure measurements that stay above 160 systolic, or above 110 diastolic, are   signs of severe preeclampsia. ° °How is this treated? °Treatment for hypertension during pregnancy varies depending on the type of hypertension you have and how  serious it is. °· If you take medicines called ACE inhibitors to treat chronic hypertension, you may need to switch medicines. ACE inhibitors should not be taken during pregnancy. °· If you have gestational hypertension, you may need to take blood pressure medicine. °· If you are at risk for preeclampsia, your health care provider may recommend that you take a low-dose aspirin every day to prevent high blood pressure during your pregnancy. °· If you have severe preeclampsia, you may need to be hospitalized so you and your baby can be monitored closely. You may also need to take medicine (magnesium sulfate) to prevent seizures and to lower blood pressure. This medicine may be given as an injection or through an IV tube. °· In some cases, if your condition gets worse, you may need to deliver your baby early. ° °Follow these instructions at home: °Eating and drinking °· Drink enough fluid to keep your urine clear or pale yellow. °· Eat a healthy diet that is low in salt (sodium). Do not add salt to your food. Check food labels to see how much sodium a food or beverage contains. °Lifestyle °· Do not use any products that contain nicotine or tobacco, such as cigarettes and e-cigarettes. If you need help quitting, ask your health care provider. °· Do not use alcohol. °· Avoid caffeine. °· Avoid stress as much as possible. Rest and get plenty of sleep. °General instructions °· Take over-the-counter and prescription medicines only as told by your health care provider. °· While lying down, lie on your left side. This keeps pressure off your baby. °· While sitting or lying down, raise (elevate) your feet. Try putting some pillows under your lower legs. °· Exercise regularly. Ask your health care provider what kinds of exercise are best for you. °· Keep all prenatal and follow-up visits as told by your health care provider. This is important. °Contact a health care provider if: °· You have symptoms that your health care  provider told you may require more treatment or monitoring, such as: °? Fever. °? Vomiting. °? Headache. °Get help right away if: °· You have severe abdominal pain or vomiting that does not get better with treatment. °· You suddenly develop swelling in your hands, ankles, or face. °· You gain 4 lbs (1.8 kg) or more in 1 week. °· You develop vaginal bleeding, or you have blood in your urine. °· You do not feel your baby moving as much as usual. °· You have blurred or double vision. °· You have muscle twitching or sudden tightening (spasms). °· You have shortness of breath. °· Your lips or fingernails turn blue. °This information is not intended to replace advice given to you by your health care provider. Make sure you discuss any questions you have with your health care provider. °Document Released: 12/04/2010 Document Revised: 10/06/2015 Document Reviewed: 09/01/2015 °Elsevier Interactive Patient Education © 2018 [employer] ° °

## 2017-09-01 NOTE — MAU Note (Signed)
Pt hx of GHTN with previous pregnancy, BP has been normal this pregnancy.  C/O HA, had blurry vision two days ago, is intermittent, occasional dizziness.  Having irregular contractions, denies bleeding or LOF.  Reports good fetal movement.

## 2017-09-11 ENCOUNTER — Encounter (HOSPITAL_COMMUNITY): Payer: Self-pay

## 2017-09-11 ENCOUNTER — Inpatient Hospital Stay (HOSPITAL_COMMUNITY): Payer: Medicaid Other | Admitting: Anesthesiology

## 2017-09-11 ENCOUNTER — Inpatient Hospital Stay (HOSPITAL_COMMUNITY)
Admission: AD | Admit: 2017-09-11 | Discharge: 2017-09-13 | DRG: 807 | Disposition: A | Discharge status: Home / Self Care | Payer: Medicaid Other | Attending: Obstetrics & Gynecology | Admitting: Obstetrics & Gynecology

## 2017-09-11 ENCOUNTER — Encounter (HOSPITAL_COMMUNITY): Payer: Self-pay | Admitting: *Deleted

## 2017-09-11 ENCOUNTER — Other Ambulatory Visit: Payer: Self-pay

## 2017-09-11 ENCOUNTER — Inpatient Hospital Stay (EMERGENCY_DEPARTMENT_HOSPITAL)
Admission: AD | Admit: 2017-09-11 | Discharge: 2017-09-11 | Disposition: A | Discharge status: Home / Self Care | Payer: Medicaid Other | Source: Ambulatory Visit | Attending: Obstetrics and Gynecology | Admitting: Obstetrics and Gynecology

## 2017-09-11 DIAGNOSIS — Z3493 Encounter for supervision of normal pregnancy, unspecified, third trimester: Secondary | ICD-10-CM

## 2017-09-11 DIAGNOSIS — O99824 Streptococcus B carrier state complicating childbirth: Secondary | ICD-10-CM | POA: Diagnosis present

## 2017-09-11 DIAGNOSIS — Z8759 Personal history of other complications of pregnancy, childbirth and the puerperium: Secondary | ICD-10-CM

## 2017-09-11 DIAGNOSIS — O99214 Obesity complicating childbirth: Secondary | ICD-10-CM | POA: Diagnosis present

## 2017-09-11 DIAGNOSIS — Z3A39 39 weeks gestation of pregnancy: Secondary | ICD-10-CM

## 2017-09-11 DIAGNOSIS — Z3483 Encounter for supervision of other normal pregnancy, third trimester: Secondary | ICD-10-CM | POA: Diagnosis present

## 2017-09-11 DIAGNOSIS — O479 False labor, unspecified: Secondary | ICD-10-CM

## 2017-09-11 DIAGNOSIS — O471 False labor at or after 37 completed weeks of gestation: Secondary | ICD-10-CM

## 2017-09-11 DIAGNOSIS — Z3480 Encounter for supervision of other normal pregnancy, unspecified trimester: Secondary | ICD-10-CM

## 2017-09-11 LAB — CBC
HCT: 32.9 % — ABNORMAL LOW (ref 36.0–46.0)
HEMOGLOBIN: 11.4 g/dL — AB (ref 12.0–15.0)
MCH: 28.9 pg (ref 26.0–34.0)
MCHC: 34.7 g/dL (ref 30.0–36.0)
MCV: 83.3 fL (ref 78.0–100.0)
Platelets: 279 10*3/uL (ref 150–400)
RBC: 3.95 MIL/uL (ref 3.87–5.11)
RDW: 13.8 % (ref 11.5–15.5)
WBC: 11.6 10*3/uL — ABNORMAL HIGH (ref 4.0–10.5)

## 2017-09-11 LAB — TYPE AND SCREEN
ABO/RH(D): A POS
Antibody Screen: NEGATIVE

## 2017-09-11 MED ORDER — LACTATED RINGERS IV SOLN
500.0000 mL | INTRAVENOUS | Status: DC | PRN
  Administered 2017-09-11: 500 mL via INTRAVENOUS

## 2017-09-11 MED ORDER — LACTATED RINGERS IV SOLN
INTRAVENOUS | Status: DC
  Administered 2017-09-11 (×2): via INTRAVENOUS

## 2017-09-11 MED ORDER — DIPHENHYDRAMINE HCL 50 MG/ML IJ SOLN: 12.5000 mg | INTRAMUSCULAR | Status: DC | PRN

## 2017-09-11 MED ORDER — PHENYLEPHRINE 40 MCG/ML (10ML) SYRINGE FOR IV PUSH (FOR BLOOD PRESSURE SUPPORT)
80.0000 ug | PREFILLED_SYRINGE | INTRAVENOUS | Status: DC | PRN
  Filled 2017-09-11: qty 5
  Filled 2017-09-11: qty 10

## 2017-09-11 MED ORDER — EPHEDRINE 5 MG/ML INJ
10.0000 mg | INTRAVENOUS | Status: DC | PRN
  Filled 2017-09-11: qty 2

## 2017-09-11 MED ORDER — OXYCODONE-ACETAMINOPHEN 5-325 MG PO TABS: 1.0000 | ORAL_TABLET | ORAL | Status: DC | PRN

## 2017-09-11 MED ORDER — LACTATED RINGERS IV SOLN: 500.0000 mL | Freq: Once | INTRAVENOUS | Status: DC

## 2017-09-11 MED ORDER — PENICILLIN G POT IN DEXTROSE 60000 UNIT/ML IV SOLN
3.0000 10*6.[IU] | INTRAVENOUS | Status: DC
  Administered 2017-09-11: 3 10*6.[IU] via INTRAVENOUS
  Filled 2017-09-11 (×5): qty 50

## 2017-09-11 MED ORDER — ACETAMINOPHEN 325 MG PO TABS: 650.0000 mg | ORAL_TABLET | ORAL | Status: DC | PRN

## 2017-09-11 MED ORDER — OXYTOCIN BOLUS FROM INFUSION: 500.0000 mL | Freq: Once | INTRAVENOUS | Status: DC

## 2017-09-11 MED ORDER — FENTANYL CITRATE (PF) 100 MCG/2ML IJ SOLN
100.0000 ug | INTRAMUSCULAR | Status: DC | PRN
  Administered 2017-09-11: 100 ug via INTRAVENOUS
  Filled 2017-09-11: qty 2

## 2017-09-11 MED ORDER — EPHEDRINE 5 MG/ML INJ: 10.0000 mg | INTRAVENOUS | Status: DC | PRN

## 2017-09-11 MED ORDER — LIDOCAINE HCL (PF) 1 % IJ SOLN
INTRAMUSCULAR | Status: DC | PRN
  Administered 2017-09-11: 5 mL via EPIDURAL

## 2017-09-11 MED ORDER — SOD CITRATE-CITRIC ACID 500-334 MG/5ML PO SOLN: 30.0000 mL | ORAL | Status: DC | PRN

## 2017-09-11 MED ORDER — SODIUM CHLORIDE 0.9 % IV SOLN
5.0000 10*6.[IU] | Freq: Once | INTRAVENOUS | Status: AC
  Administered 2017-09-11: 5 10*6.[IU] via INTRAVENOUS
  Filled 2017-09-11: qty 5

## 2017-09-11 MED ORDER — ONDANSETRON HCL 4 MG/2ML IJ SOLN: 4.0000 mg | Freq: Four times a day (QID) | INTRAMUSCULAR | Status: DC | PRN

## 2017-09-11 MED ORDER — OXYCODONE-ACETAMINOPHEN 5-325 MG PO TABS: 2.0000 | ORAL_TABLET | ORAL | Status: DC | PRN

## 2017-09-11 MED ORDER — OXYTOCIN 40 UNITS IN LACTATED RINGERS INFUSION - SIMPLE MED
2.5000 [IU]/h | INTRAVENOUS | Status: DC
  Filled 2017-09-11: qty 1000

## 2017-09-11 MED ORDER — PHENYLEPHRINE 40 MCG/ML (10ML) SYRINGE FOR IV PUSH (FOR BLOOD PRESSURE SUPPORT)
80.0000 ug | PREFILLED_SYRINGE | INTRAVENOUS | Status: DC | PRN
  Filled 2017-09-11: qty 5

## 2017-09-11 MED ORDER — PHENYLEPHRINE 40 MCG/ML (10ML) SYRINGE FOR IV PUSH (FOR BLOOD PRESSURE SUPPORT): 80.0000 ug | PREFILLED_SYRINGE | INTRAVENOUS | Status: DC | PRN

## 2017-09-11 MED ORDER — FENTANYL 2.5 MCG/ML BUPIVACAINE 1/10 % EPIDURAL INFUSION (WH - ANES)
14.0000 mL/h | INTRAMUSCULAR | Status: DC | PRN
  Administered 2017-09-11: 14 mL/h via EPIDURAL
  Filled 2017-09-11: qty 100

## 2017-09-11 MED ORDER — LIDOCAINE HCL (PF) 1 % IJ SOLN
30.0000 mL | INTRAMUSCULAR | Status: DC | PRN
  Filled 2017-09-11: qty 30

## 2017-09-11 NOTE — Discharge Instructions (Signed)
Vaginal Delivery Vaginal delivery means that you will give birth by pushing your baby out of your birth canal (vagina). A team of health care providers will help you before, during, and after vaginal delivery. Birth experiences are unique for every woman and every pregnancy, and birth experiences vary depending on where you choose to give birth. What should I do to prepare for my baby's birth? Before your baby is born, it is important to talk with your health care provider about:  Your labor and delivery preferences. These may include: ? Medicines that you may be given. ? How you will manage your pain. This might include non-medical pain relief techniques or injectable pain relief such as epidural analgesia. ? How you and your baby will be monitored during labor and delivery. ? Who may be in the labor and delivery room with you. ? Your feelings about surgical delivery of your baby (cesarean delivery, or C-section) if this becomes necessary. ? Your feelings about receiving donated blood through an IV tube (blood transfusion) if this becomes necessary.  Whether you are able: ? To take pictures or videos of the birth. ? To eat during labor and delivery. ? To move around, walk, or change positions during labor and delivery.  What to expect after your baby is born, such as: ? Whether delayed umbilical cord clamping and cutting is offered. ? Who will care for your baby right after birth. ? Medicines or tests that may be recommended for your baby. ? Whether breastfeeding is supported in your hospital or birth center. ? How long you will be in the hospital or birth center.  How any medical conditions you have may affect your baby or your labor and delivery experience.  To prepare for your baby's birth, you should also:  Attend all of your health care visits before delivery (prenatal visits) as recommended by your health care provider. This is important.  Prepare your home for your baby's  arrival. Make sure that you have: ? Diapers. ? Baby clothing. ? Feeding equipment. ? Safe sleeping arrangements for you and your baby.  Install a car seat in your vehicle. Have your car seat checked by a certified car seat installer to make sure that it is installed safely.  Think about who will help you with your new baby at home for at least the first several weeks after delivery.  What can I expect when I arrive at the birth center or hospital? Once you are in labor and have been admitted into the hospital or birth center, your health care provider may:  Review your pregnancy history and any concerns you have.  Insert an IV tube into one of your veins. This is used to give you fluids and medicines.  Check your blood pressure, pulse, temperature, and heart rate (vital signs).  Check whether your bag of water (amniotic sac) has broken (ruptured).  Talk with you about your birth plan and discuss pain control options.  Monitoring Your health care provider may monitor your contractions (uterine monitoring) and your baby's heart rate (fetal monitoring). You may need to be monitored:  Often, but not continuously (intermittently).  All the time or for long periods at a time (continuously). Continuous monitoring may be needed if: ? You are taking certain medicines, such as medicine to relieve pain or make your contractions stronger. ? You have pregnancy or labor complications.  Monitoring may be done by:  Placing a special stethoscope or a handheld monitoring device on your abdomen to   check your baby's heartbeat, and feeling your abdomen for contractions. This method of monitoring does not continuously record your baby's heartbeat or your contractions.  Placing monitors on your abdomen (external monitors) to record your baby's heartbeat and the frequency and length of contractions. You may not have to wear external monitors all the time.  Placing monitors inside of your uterus  (internal monitors) to record your baby's heartbeat and the frequency, length, and strength of your contractions. ? Your health care provider may use internal monitors if he or she needs more information about the strength of your contractions or your baby's heart rate. ? Internal monitors are put in place by passing a thin, flexible wire through your vagina and into your uterus. Depending on the type of monitor, it may remain in your uterus or on your baby's head until birth. ? Your health care provider will discuss the benefits and risks of internal monitoring with you and will ask for your permission before inserting the monitors.  Telemetry. This is a type of continuous monitoring that can be done with external or internal monitors. Instead of having to stay in bed, you are able to move around during telemetry. Ask your health care provider if telemetry is an option for you.  Physical exam Your health care provider may perform a physical exam. This may include:  Checking whether your baby is positioned: ? With the head toward your vagina (head-down). This is most common. ? With the head toward the top of your uterus (head-up or breech). If your baby is in a breech position, your health care provider may try to turn your baby to a head-down position so you can deliver vaginally. If it does not seem that your baby can be born vaginally, your provider may recommend surgery to deliver your baby. In rare cases, you may be able to deliver vaginally if your baby is head-up (breech delivery). ? Lying sideways (transverse). Babies that are lying sideways cannot be delivered vaginally.  Checking your cervix to determine: ? Whether it is thinning out (effacing). ? Whether it is opening up (dilating). ? How low your baby has moved into your birth canal.  What are the three stages of labor and delivery?  Normal labor and delivery is divided into the following three stages: Stage 1  Stage 1 is the  longest stage of labor, and it can last for hours or days. Stage 1 includes: ? Early labor. This is when contractions may be irregular, or regular and mild. Generally, early labor contractions are more than 10 minutes apart. ? Active labor. This is when contractions get longer, more regular, more frequent, and more intense. ? The transition phase. This is when contractions happen very close together, are very intense, and may last longer than during any other part of labor.  Contractions generally feel mild, infrequent, and irregular at first. They get stronger, more frequent (about every 2-3 minutes), and more regular as you progress from early labor through active labor and transition.  Many women progress through stage 1 naturally, but you may need help to continue making progress. If this happens, your health care provider may talk with you about: ? Rupturing your amniotic sac if it has not ruptured yet. ? Giving you medicine to help make your contractions stronger and more frequent.  Stage 1 ends when your cervix is completely dilated to 4 inches (10 cm) and completely effaced. This happens at the end of the transition phase. Stage 2  Once   your cervix is completely effaced and dilated to 4 inches (10 cm), you may start to feel an urge to push. It is common for the body to naturally take a rest before feeling the urge to push, especially if you received an epidural or certain other pain medicines. This rest period may last for up to 1-2 hours, depending on your unique labor experience.  During stage 2, contractions are generally less painful, because pushing helps relieve contraction pain. Instead of contraction pain, you may feel stretching and burning pain, especially when the widest part of your baby's head passes through the vaginal opening (crowning).  Your health care provider will closely monitor your pushing progress and your baby's progress through the vagina during stage 2.  Your  health care provider may massage the area of skin between your vaginal opening and anus (perineum) or apply warm compresses to your perineum. This helps it stretch as the baby's head starts to crown, which can help prevent perineal tearing. ? In some cases, an incision may be made in your perineum (episiotomy) to allow the baby to pass through the vaginal opening. An episiotomy helps to make the opening of the vagina larger to allow more room for the baby to fit through.  It is very important to breathe and focus so your health care provider can control the delivery of your baby's head. Your health care provider may have you decrease the intensity of your pushing, to help prevent perineal tearing.  After delivery of your baby's head, the shoulders and the rest of the body generally deliver very quickly and without difficulty.  Once your baby is delivered, the umbilical cord may be cut right away, or this may be delayed for 1-2 minutes, depending on your baby's health. This may vary among health care providers, hospitals, and birth centers.  If you and your baby are healthy enough, your baby may be placed on your chest or abdomen to help maintain the baby's temperature and to help you bond with each other. Some mothers and babies start breastfeeding at this time. Your health care team will dry your baby and help keep your baby warm during this time.  Your baby may need immediate care if he or she: ? Showed signs of distress during labor. ? Has a medical condition. ? Was born too early (prematurely). ? Had a bowel movement before birth (meconium). ? Shows signs of difficulty transitioning from being inside the uterus to being outside of the uterus. If you are planning to breastfeed, your health care team will help you begin a feeding. Stage 3  The third stage of labor starts immediately after the birth of your baby and ends after you deliver the placenta. The placenta is an organ that develops  during pregnancy to provide oxygen and nutrients to your baby in the womb.  Delivering the placenta may require some pushing, and you may have mild contractions. Breastfeeding can stimulate contractions to help you deliver the placenta.  After the placenta is delivered, your uterus should tighten (contract) and become firm. This helps to stop bleeding in your uterus. To help your uterus contract and to control bleeding, your health care provider may: ? Give you medicine by injection, through an IV tube, by mouth, or through your rectum (rectally). ? Massage your abdomen or perform a vaginal exam to remove any blood clots that are left in your uterus. ? Empty your bladder by placing a thin, flexible tube (catheter) into your bladder. ? Encourage   you to breastfeed your baby. After labor is over, you and your baby will be monitored closely to ensure that you are both healthy until you are ready to go home. Your health care team will teach you how to care for yourself and your baby. This information is not intended to replace advice given to you by your health care provider. Make sure you discuss any questions you have with your health care provider. Document Released: 12/26/2007 Document Revised: 10/06/2015 Document Reviewed: 04/02/2015 Elsevier Interactive Patient Education  2018 [employer]  

## 2017-09-11 NOTE — Progress Notes (Addendum)
Labor Progress Note Patricia [name] is a 22 y.o. G2P1001 at 1271w4d presented for SOL. S: Recently had epidural placed, still feeling   O:  BP 129/85   Pulse (!) 102   Temp 97.8 F (36.6 C) (Axillary)   Resp 18   Ht 4\' 11"  (1.499 m)   Wt 82.9 kg (182 lb 12 oz)   LMP 12/08/2016   SpO2 100%   BMI 36.91 kg/m  EFM: 125 bpm/mod variability/+ accel, no decel Ctx q2-4 mins  CVE: Dilation: 5 Effacement (%): 80 Cervical Position: Middle Station: -2 Presentation: Vertex Exam by:: Doreatha MassedF. Morris, RNC   A&P: 22 y.o. G2P1001 2971w4d here for SOL. #Labor: Progressing well. Plan for AROM at 20:00 (4 hours after PCN dose). #Pain: epidural in place #FWB: Cat 1 FHT #GBS positive  GrenadaBrittany P Maniah Nading, MD 9:01 PM   Addendum: AROM at 22:10 with light med-stained fluid. SVE now 9.5/100/0.
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
## 2017-09-11 NOTE — Anesthesia Preprocedure Evaluation (Addendum)
Anesthesia Evaluation  Patient identified by MRN, date of birth, ID band Patient awake    Reviewed: Allergy & Precautions, NPO status , Patient's Chart, lab work & pertinent test results  Airway Mallampati: III  TM Distance: >3 FB Neck ROM: Full    Dental no notable dental hx. (+) Teeth Intact, Dental Advisory Given   Pulmonary neg pulmonary ROS,    Pulmonary exam normal breath sounds clear to auscultation       Cardiovascular negative cardio ROS Normal cardiovascular exam Rhythm:Regular Rate:Normal     Neuro/Psych negative neurological ROS  negative psych ROS   GI/Hepatic negative GI ROS, Neg liver ROS,   Endo/Other  Morbid obesity  Renal/GU negative Renal ROS     Musculoskeletal   Abdominal (+) + obese,   Peds  Hematology negative hematology ROS (+)   Anesthesia Other Findings   Reproductive/Obstetrics (+) Pregnancy                            Lab Results  Component Value Date   WBC 11.6 (H) 09/11/2017   HGB 11.4 (L) 09/11/2017   HCT 32.9 (L) 09/11/2017   MCV 83.3 09/11/2017   PLT 279 09/11/2017    Anesthesia Physical Anesthesia Plan  ASA: III  Anesthesia Plan: Epidural   Post-op Pain Management:    Induction:   PONV Risk Score and Plan:   Airway Management Planned:   Additional Equipment:   Intra-op Plan:   Post-operative Plan:   Informed Consent: I have reviewed the patients History and Physical, chart, labs and discussed the procedure including the risks, benefits and alternatives for the proposed anesthesia with the patient or authorized representative who has indicated his/her understanding and acceptance.     Plan Discussed with:   Anesthesia Plan Comments:         Anesthesia Quick Evaluation

## 2017-09-11 NOTE — MAU Provider Note (Deleted)
Nurses notes, vital signs, and fetal tracing reviewed. Orders and impression placed.  GBS+ Braxton Hicks Contractions Reactive NST: Baseline 140, positive accelerations, no decelerations Toco: contractions q 2-6 min No cervical change after ambulating Return for general obstetric concerns including but limited to worsening contractions, decreased fetal movement, leaking fluid, vaginal bleeding, headache not relieved by Tylenol Discharge home in stable condition  Calvert CantorSamantha C Jakhiya Brower, CNM 09/11/2017, 11:28 AM

## 2017-09-11 NOTE — MAU Note (Signed)
Pt presents with c/o ctxs that began this morning @ 0440, but have increased in frequency and intensity since 1300.  Denies LOF or VB.  Reports +FM.

## 2017-09-11 NOTE — Progress Notes (Signed)
Nurses notes, vital signs, and fetal tracing reviewed. EFM: 140 bpm, moderate variability, positive accels, no decels Toco: 2-6 and irregular. Orders and impression placed.  Clayton BiblesSamantha Garnetta Fedrick, CNM 09/11/17  11:26am

## 2017-09-11 NOTE — MAU Note (Signed)
Pt states that UCs began this am approx 0430 & have gotten more intense & frequency since then.  Denies LOF/vag bleeding.  Endorses +FM.

## 2017-09-11 NOTE — Anesthesia Procedure Notes (Signed)
Epidural Patient location during procedure: OB Start time: 09/11/2017 7:52 PM End time: 09/11/2017 8:06 PM  Staffing Anesthesiologist: Trevor IhaHouser, Jolette Lana A, MD Performed: anesthesiologist   Preanesthetic Checklist Completed: patient identified, site marked, surgical consent, pre-op evaluation, timeout performed, IV checked, risks and benefits discussed and monitors and equipment checked  Epidural Patient position: sitting Prep: site prepped and draped and DuraPrep Patient monitoring: continuous pulse ox and blood pressure Approach: midline Location: L3-L4 Injection technique: LOR air  Needle:  Needle type: Tuohy  Needle gauge: 17 G Needle length: 9 cm and 9 Needle insertion depth: 6 cm Catheter type: closed end flexible Catheter size: 19 Gauge Catheter at skin depth: 12 cm Test dose: negative  Assessment Events: blood not aspirated, injection not painful, no injection resistance, negative IV test and no paresthesia

## 2017-09-11 NOTE — Progress Notes (Signed)
    09/11/17 0925 09/11/17 0936  Fetal Heart Rate A  Mode  --  External  Baseline Rate (A)  --  140 bpm  Variability  --  6-25 BPM  Accelerations  --  15 x 15  Decelerations  --  None  Uterine Activity  Mode  --  Toco  Contraction Frequency (min)  --  4-6  Contraction Duration (sec)  --  50-80  Contraction Quality  --  Mild;Moderate  Resting Tone Palpated  --  Relaxed  Resting Time  --  Adequate  Cervical Exam  Dilation 3.5  --   Effacement (%) 70  --   Cervical Position Posterior  --   Cervical Consistency Soft  --   Vag. Bleeding None  --   Station -2  --   Presentation Vertex  --   Exam by: Claudette Lawsjaton Castor Gittleman rnc  --   Report called to CNM re: above assessment to include pt background & GBS status; & that pt reports SVE on 2cm 2days ago.  Orders rec'd to allow pt to ambulate & recheck cervix.  EFM disconnected to for pt to ambulate. POC discussed with pt & sig other; pt told to return to room if gush/LOF or heavy vag bleeding or sudden intense pain.  Pt verb understanding & will return for assessment at 1045.

## 2017-09-11 NOTE — Progress Notes (Signed)
   09/11/17 1058 09/11/17 1116  Fetal Heart Rate A  Mode  --  External  Baseline Rate (A)  --  145 bpm  Variability  --  6-25 BPM  Accelerations  --  15 x 15  Decelerations  --  None  Uterine Activity  Mode  --  Toco  Contraction Frequency (min)  --  4-6  Contraction Duration (sec)  --  80  Contraction Quality  --  Mild;Moderate  Resting Tone Palpated  --  Relaxed  Resting Time  --  Adequate  Cervical Exam  Dilation 3.5  --   Effacement (%) 60  --   Cervical Position Posterior  --   Cervical Consistency Soft  --   Vag. Bleeding Other  --   Clots None  --   Station [employer]  --   Presentation Vertex  --   Report given to Sam, CNM re: above assessment. Orders rec'd to d/c home.

## 2017-09-12 ENCOUNTER — Encounter (HOSPITAL_COMMUNITY): Payer: Self-pay

## 2017-09-12 DIAGNOSIS — O99824 Streptococcus B carrier state complicating childbirth: Secondary | ICD-10-CM

## 2017-09-12 DIAGNOSIS — Z3A39 39 weeks gestation of pregnancy: Secondary | ICD-10-CM

## 2017-09-12 LAB — RPR: RPR: NONREACTIVE

## 2017-09-12 MED ORDER — DIBUCAINE 1 % RE OINT: 1.0000 "application " | TOPICAL_OINTMENT | RECTAL | Status: DC | PRN

## 2017-09-12 MED ORDER — SENNOSIDES-DOCUSATE SODIUM 8.6-50 MG PO TABS
2.0000 | ORAL_TABLET | ORAL | Status: DC
  Administered 2017-09-13: 2 via ORAL
  Filled 2017-09-12: qty 2

## 2017-09-12 MED ORDER — ACETAMINOPHEN 325 MG PO TABS: 650.0000 mg | ORAL_TABLET | ORAL | Status: DC | PRN

## 2017-09-12 MED ORDER — ONDANSETRON HCL 4 MG/2ML IJ SOLN: 4.0000 mg | INTRAMUSCULAR | Status: DC | PRN

## 2017-09-12 MED ORDER — ZOLPIDEM TARTRATE 5 MG PO TABS: 5.0000 mg | ORAL_TABLET | Freq: Every evening | ORAL | Status: DC | PRN

## 2017-09-12 MED ORDER — DIPHENHYDRAMINE HCL 25 MG PO CAPS: 25.0000 mg | ORAL_CAPSULE | Freq: Four times a day (QID) | ORAL | Status: DC | PRN

## 2017-09-12 MED ORDER — BENZOCAINE-MENTHOL 20-0.5 % EX AERO: 1.0000 "application " | INHALATION_SPRAY | CUTANEOUS | Status: DC | PRN

## 2017-09-12 MED ORDER — COCONUT OIL OIL: 1.0000 "application " | TOPICAL_OIL | Status: DC | PRN

## 2017-09-12 MED ORDER — IBUPROFEN 600 MG PO TABS
600.0000 mg | ORAL_TABLET | Freq: Four times a day (QID) | ORAL | Status: DC
  Administered 2017-09-12 – 2017-09-13 (×6): 600 mg via ORAL
  Filled 2017-09-12 (×6): qty 1

## 2017-09-12 MED ORDER — ONDANSETRON HCL 4 MG PO TABS: 4.0000 mg | ORAL_TABLET | ORAL | Status: DC | PRN

## 2017-09-12 MED ORDER — WITCH HAZEL-GLYCERIN EX PADS: 1.0000 "application " | MEDICATED_PAD | CUTANEOUS | Status: DC | PRN

## 2017-09-12 MED ORDER — PRENATAL MULTIVITAMIN CH
1.0000 | ORAL_TABLET | Freq: Every day | ORAL | Status: DC
  Administered 2017-09-12 – 2017-09-13 (×2): 1 via ORAL
  Filled 2017-09-12 (×2): qty 1

## 2017-09-12 MED ORDER — TETANUS-DIPHTH-ACELL PERTUSSIS 5-2.5-18.5 LF-MCG/0.5 IM SUSP: 0.5000 mL | Freq: Once | INTRAMUSCULAR | Status: DC

## 2017-09-12 MED ORDER — SIMETHICONE 80 MG PO CHEW: 80.0000 mg | CHEWABLE_TABLET | ORAL | Status: DC | PRN

## 2017-09-12 NOTE — Lactation Note (Signed)
This note was copied from a baby's chart. Lactation Consultation Note  Patient Name: Patricia [name] Reason for consult: Initial assessment;Term Breastfeeding consultation services and support information given to patient.  This is her second baby.  Her first baby did not latch well and she had a low milk supply.  She transitioned to formula feeding.  Mom reports that newborn is latching well.  Baby is 14 hours old.  She knows how to hand express and can express drops.  Instructed to feed with feeding cues and call for assist/concerns prn.  Maternal Data Has patient been taught Hand Expression?: Yes Does the patient have breastfeeding experience prior to this delivery?: Yes  Feeding Feeding Type: Bottle Fed - Formula Nipple Type: Slow - flow Length of feed: 20 min  LATCH Score                   Interventions    Lactation Tools Discussed/Used     Consult Status Consult Status: Follow-up Date: 09/13/17 Follow-up type: In-patient    Huston FoleyMOULDEN, [name] S [name], 2:50 PM

## 2017-09-12 NOTE — Anesthesia Postprocedure Evaluation (Signed)
Anesthesia Post Note  Patient: Anson FretMaria Marschner  Procedure(s) Performed: AN AD HOC LABOR EPIDURAL     Patient location during evaluation: Mother Baby Anesthesia Type: Epidural Level of consciousness: awake and alert and oriented Pain management: satisfactory to patient Vital Signs Assessment: post-procedure vital signs reviewed and stable Respiratory status: spontaneous breathing and nonlabored ventilation Cardiovascular status: stable Postop Assessment: no headache, no backache, no signs of nausea or vomiting, adequate PO intake, patient able to bend at knees and able to ambulate (patient up walking) Anesthetic complications: no    Last Vitals:  Vitals:   09/12/17 0415 09/12/17 0600  BP: 120/79   Pulse: (!) 102   Resp: 18   Temp: 37.4 C 37.3 C  SpO2: 99%     Last Pain:  Vitals:   09/12/17 0600  TempSrc: Oral  PainSc:    Pain Goal:                 Tameshia Bonneville

## 2017-09-13 MED ORDER — IBUPROFEN 600 MG PO TABS: 600.0000 mg | ORAL_TABLET | Freq: Four times a day (QID) | ORAL | 0 refills | Status: AC

## 2017-09-13 NOTE — Lactation Note (Signed)
This note was copied from a baby's chart. Lactation Consultation Note  Patient Name: Patricia [name] Reason for consult: Follow-up assessment Mom states baby is latching well and she continues to supplement with formula.  Mom has a history of low milk supply so recommended she begin pumping with a DEBP.  Mom agreeable.  Symphony pump set up and instructions given.  Mom will begin pumping once she finishes her lunch.  Instructed to Pump every 3 hours and call for assist/concerns prn.  Maternal Data    Feeding    LATCH Score                   Interventions    Lactation Tools Discussed/Used Pump Review: Setup, frequency, and cleaning Initiated by:: LM Date initiated:: 09/13/17   Consult Status Consult Status: Follow-up Date: 09/14/17 Follow-up type: In-patient    Huston FoleyMOULDEN, Noelani Harbach S [name], 11:58 AM

## 2017-09-13 NOTE — Discharge Instructions (Signed)

## 2017-09-13 NOTE — Progress Notes (Signed)
Mother discharged, baby patient. Follow up care, meds, and discharge instructions reviewed with patient. teachback used

## 2017-09-13 NOTE — Discharge Summary (Signed)
OB Discharge Summary     Patient Name: Patricia [name] DOB: 07/06/1995 MRN: [id number]  Date of admission: 09/11/2017 Delivering MD: Lilly CoveHIPKINS, BRITTANY P   Date of discharge: 09/13/2017  Admitting diagnosis: 39WKS CTX Intrauterine pregnancy: 3087w5d     Secondary diagnosis:  Principal Problem:   SVD (spontaneous vaginal delivery) Active Problems:   Normal labor  Additional problems: Varicella non-immune     Discharge diagnosis: Term Pregnancy Delivered                                                                                                Post partum procedures:none  Augmentation: AROM  Complications: None  Hospital course:  Onset of Labor With Vaginal Delivery     22 y.o. yo [id number] at 2387w5d was admitted in Active Labor on 09/11/2017. Patient had an uncomplicated labor course as follows:  Membrane Rupture Time/Date: 10:05 PM ,09/11/2017   Intrapartum Procedures: Episiotomy: None [1]                                         Lacerations:  Perineal [11]  Patient had a delivery of a Viable infant. 09/12/2017  Information for the patient's newborn:  Rock NephewBautista, Girl Derricka [[id number]][[id number]]  Delivery Method: Vaginal, Spontaneous(Filed from Delivery Summary)    Pateint had an uncomplicated postpartum course.  She is ambulating, tolerating a regular diet, passing flatus, and urinating well. Patient is discharged home in stable condition on 09/13/17.   Physical exam  Vitals:   09/12/17 0815 09/12/17 1625 09/13/17 0102 09/13/17 0500  BP: 117/70 124/74  (!) 122/97  Pulse: 97 95 92 87  Resp: 16 18 16 16   Temp: 98.7 F (37.1 C) 98.4 F (36.9 C) 98.5 F (36.9 C) 98.4 F (36.9 C)  TempSrc: Oral Oral Oral Oral  SpO2:   98% 100%  Weight:      Height:       General: alert, cooperative and no distress Lochia: appropriate Uterine Fundus: firm Incision: N/A DVT Evaluation: No evidence of DVT seen on physical exam. No cords or calf tenderness. No significant calf/ankle  edema. Labs: Lab Results  Component Value Date   WBC 11.6 (H) 09/11/2017   HGB 11.4 (L) 09/11/2017   HCT 32.9 (L) 09/11/2017   MCV 83.3 09/11/2017   PLT 279 09/11/2017   CMP Latest Ref Rng & Units 02/06/2017  Glucose 65 - 99 mg/dL 80  BUN 6 - 20 mg/dL 15  Creatinine 1.300.44 - 8.651.00 mg/dL 7.840.88  Sodium 696135 - 295145 mmol/L 135  Potassium 3.5 - 5.1 mmol/L 4.0  Chloride 101 - 111 mmol/L 100(L)  CO2 22 - 32 mmol/L 18(L)  Calcium 8.9 - 10.3 mg/dL 9.9  Total Protein 6.5 - 8.1 g/dL 2.8(U8.6(H)  Total Bilirubin 0.3 - 1.2 mg/dL 0.7  Alkaline Phos 38 - 126 U/L 65  AST 15 - 41 U/L 26  ALT 14 - 54 U/L 26    Discharge instruction: per After Visit Summary and "Baby and Me Booklet".  After visit  meds:  Allergies as of 09/13/2017      Reactions   Phenergan [promethazine Hcl] Other (See Comments)   Leg spasms, extrapyramidal effects   Reglan [metoclopramide]    Extrapyramidal effects      Medication List    STOP taking these medications   Doxylamine-Pyridoxine 10-10 MG Tbec   ondansetron 4 MG tablet Commonly known as:  ZOFRAN     TAKE these medications   ibuprofen 600 MG tablet Commonly known as:  ADVIL,MOTRIN Take 1 tablet (600 mg total) by mouth every 6 (six) hours.   prenatal multivitamin Tabs tablet Take 1 tablet by mouth daily.       Diet: routine diet  Activity: Advance as tolerated. Pelvic rest for 6 weeks.   Outpatient follow up:6 weeks Follow up Appt:No future appointments. Follow up Visit: Follow-up Information    Department, [facility]. Schedule an appointment as soon as possible for a visit.   Why:  For postpartum visit in 6 weeks Contact information: [address] [place] [address] [phone]          Postpartum contraception: IUD  Newborn Data: Live born female  Birth Weight: 7 lb 10.9 oz (3484 g) APGAR: 9, 9  Newborn Delivery   Birth date/time:  09/12/2017 00:46:00 Delivery type:  Vaginal, Spontaneous     Baby Feeding:  Breast Disposition:home with mother   09/13/2017 Caryl Ada, DO

## 2017-09-14 ENCOUNTER — Ambulatory Visit: Payer: Self-pay

## 2017-09-14 NOTE — Lactation Note (Signed)
This note was copied from a baby's chart. Lactation Consultation Note  Patient Name: Patricia [name] [name]'s Date: 09/14/2017 Reason for consult: Follow-up assessment Mom giving mostly formula bottles.  She states she is pumping and obtaining small amounts of milk.  Mom reports baby can still latch and declines assist this morning.  Discussed milk coming to volume.  Lactation outpatient services and support reviewed and encouraged.  Maternal Data    Feeding Nipple Type: Slow - flow  LATCH Score                   Interventions    Lactation Tools Discussed/Used     Consult Status Consult Status: Complete Follow-up type: Call as needed    Huston FoleyMOULDEN, Rashema Seawright S 09/14/2017, 9:31 AM

## 2018-09-18 ENCOUNTER — Other Ambulatory Visit: Payer: Self-pay | Admitting: *Deleted

## 2018-09-18 DIAGNOSIS — Z20822 Contact with and (suspected) exposure to covid-19: Secondary | ICD-10-CM

## 2018-09-20 ENCOUNTER — Emergency Department (HOSPITAL_COMMUNITY)
Admission: EM | Admit: 2018-09-20 | Discharge: 2018-09-20 | Disposition: A | Discharge status: Home / Self Care | Payer: Medicaid Other | Attending: Emergency Medicine | Admitting: Emergency Medicine

## 2018-09-20 ENCOUNTER — Emergency Department (HOSPITAL_COMMUNITY): Payer: Medicaid Other

## 2018-09-20 ENCOUNTER — Other Ambulatory Visit: Payer: Self-pay

## 2018-09-20 DIAGNOSIS — U071 COVID-19: Secondary | ICD-10-CM | POA: Diagnosis not present

## 2018-09-20 DIAGNOSIS — R0602 Shortness of breath: Secondary | ICD-10-CM | POA: Insufficient documentation

## 2018-09-20 DIAGNOSIS — F419 Anxiety disorder, unspecified: Secondary | ICD-10-CM | POA: Insufficient documentation

## 2018-09-20 LAB — NOVEL CORONAVIRUS, NAA: SARS-CoV-2, NAA: DETECTED — AB

## 2018-09-20 NOTE — ED Notes (Signed)
Patient verbalizes understanding of discharge instructions. Opportunity for questioning and answers were provided. Armband removed by staff, pt discharged from ED.  

## 2018-09-20 NOTE — ED Triage Notes (Signed)
Pt c/o SOB since Friday. Was tested for Covid on Friday and told today that she is positive. Sats in triage 100%. Pt states "I dont know if I am over thinking it".

## 2018-09-20 NOTE — ED Provider Notes (Signed)
[name]   CSN: [id number] Arrival date & time: 09/20/18  2122     History   Chief Complaint Chief Complaint  Patient presents with  . Shortness of Breath    HPI Patricia [name] is a 23 y.o. female.     Patient presents to the emergency department with a chief complaint of shortness of breath.  She states that she was recently diagnosed with COVID-19.  She was tested 3 days ago.  She reports that she has felt very anxious, and feels that this is contributing to her shortness of breath.  She denies any severe cough.  States that she has had a objective fever at home, but but did not actually measure it.  She denies any other associated symptoms.  Has been self quarantining.  The history is provided by the patient. No language interpreter was used.   Patricia FretMaria [name] was evaluated in Emergency Department on 09/20/2018 for the symptoms described in the history of present illness. She was evaluated in the context of the global COVID-19 pandemic, which necessitated consideration that the patient might be at risk for infection with the SARS-CoV-2 virus that causes COVID-19. Institutional protocols and algorithms that pertain to the evaluation of patients at risk for COVID-19 are in a state of rapid change based on information released by regulatory bodies including the CDC and federal and state organizations. These policies and algorithms were followed during the patient's care in the ED.  Past Medical History:  Diagnosis Date  . Hyperemesis   . Pregnancy induced hypertension 2016   GHTN    Patient Active Problem List   Diagnosis Date Noted  . SVD (spontaneous vaginal delivery) 09/12/2017  . Normal labor 09/11/2017  . Supervision of other normal pregnancy, antepartum 01/19/2017  . History of gestational hypertension 01/19/2017    Past Surgical History:  Procedure Laterality Date  . NO PAST SURGERIES       OB History    Gravida  2   Para  2   Term  2   Preterm      AB      Living  2     SAB      TAB      Ectopic      Multiple  0   Live Births  2            Home Medications    Prior to Admission medications   Medication Sig Start Date End Date Taking? Authorizing Provider  ibuprofen (ADVIL,MOTRIN) 600 MG tablet Take 1 tablet (600 mg total) by mouth every 6 (six) hours. 09/13/17   Pincus LargePhelps, Jazma Y, DO  Prenatal Vit-Fe Fumarate-FA (PRENATAL MULTIVITAMIN) TABS tablet Take 1 tablet by mouth daily.     [provider]    Family History Family History  Problem Relation Age of Onset  . Miscarriages / [place] Mother   . Hyperlipidemia Father   . Diabetes Maternal Grandmother   . Hypertension Maternal Grandfather     Social History Social History   Tobacco Use  . Smoking status: Never Smoker  . Smokeless tobacco: Never Used  Substance Use Topics  . Alcohol use: No  . Drug use: No     Allergies   Phenergan [promethazine hcl] and Reglan [metoclopramide]   Review of Systems Review of Systems  All other systems reviewed and are negative.    Physical Exam Updated Vital Signs BP (!) 151/112   Pulse 88   Temp  99.1 F (37.3 C) (Oral)   Resp 16   Ht 4\' 11"  (1.499 m)   Wt 81.2 kg   LMP 09/14/2018 (Exact Date)   SpO2 100%   BMI 36.15 kg/m   Physical Exam Vitals signs and nursing note reviewed.  Constitutional:      General: She is not in acute distress.    Appearance: She is well-developed.  HENT:     Head: Normocephalic and atraumatic.  Eyes:     Conjunctiva/sclera: Conjunctivae normal.  Neck:     Musculoskeletal: Neck supple.  Cardiovascular:     Rate and Rhythm: Normal rate and regular rhythm.     Heart sounds: No murmur.  Pulmonary:     Effort: Pulmonary effort is normal. No respiratory distress.     Breath sounds: No stridor.  Abdominal:     General: There is no distension.     Palpations: Abdomen is soft.  Musculoskeletal: Normal range  of motion.  Skin:    General: Skin is warm and dry.  Neurological:     Mental Status: She is alert and oriented to person, place, and time.  Psychiatric:        Mood and Affect: Mood normal.        Behavior: Behavior normal.        Thought Content: Thought content normal.        Judgment: Judgment normal.      ED Treatments / Results  Labs (all labs ordered are listed, but only abnormal results are displayed) Labs Reviewed - No data to display  EKG    Radiology Dg Chest Portable 1 View  Result Date: 09/20/2018 CLINICAL DATA:  Short of breath EXAM: PORTABLE CHEST 1 VIEW COMPARISON:  03/14/2004 FINDINGS: The heart size and mediastinal contours are within normal limits. Both lungs are clear. The visualized skeletal structures are unremarkable. IMPRESSION: No active disease. Electronically Signed   By: Donavan Foil M.D.   On: 09/20/2018 22:14    Procedures Procedures (including critical care time)  Medications Ordered in ED Medications - No data to display   Initial Impression / Assessment and Plan / ED Course  I have reviewed the triage vital signs and the nursing notes.  Pertinent labs & imaging results that were available during my care of the patient were reviewed by me and considered in my medical decision making (see chart for details).        Patient with positive COVID-19 test. Feels anxious and feels like this is causing worsening SOB.  O2 sat in triage is 100%.  Patient in no acute distress.  VSS.  CXR is clear.  Non-toxic appearing.  DC to home with continued self monitoring/quarantine .  Final Clinical Impressions(s) / ED Diagnoses   Final diagnoses:  SOB (shortness of breath)  2019 novel coronavirus disease (COVID-19)    ED Discharge Orders    None       Montine Circle, PA-C 09/20/18 2234    Lennice Sites, DO 09/21/18 0023

## 2018-09-21 ENCOUNTER — Telehealth: Payer: Self-pay

## 2018-09-21 NOTE — Telephone Encounter (Signed)
Faxed COVID results to [facility].; AttnLynelle Smoke, @ [phone].

## 2018-09-30 ENCOUNTER — Other Ambulatory Visit: Payer: Self-pay | Admitting: *Deleted

## 2018-09-30 DIAGNOSIS — Z20822 Contact with and (suspected) exposure to covid-19: Secondary | ICD-10-CM

## 2018-10-05 LAB — NOVEL CORONAVIRUS, NAA: SARS-CoV-2, NAA: NOT DETECTED

## 2019-09-03 IMAGING — US US OB COMP LESS 14 WK
1 series · 15 of 28 positions shown · non-contrast
Comparison: None.

CLINICAL DATA: Cramping and vaginal spotting. Positive pregnancy
test.

EXAM:
OBSTETRIC <14 WK US AND TRANSVAGINAL OB US
TECHNIQUE: Both transabdominal and transvaginal ultrasound examinations were
performed for complete evaluation of the gestation as well as the
maternal uterus, adnexal regions, and pelvic cul-de-sac.
Transvaginal technique was performed to assess early pregnancy.

[Series 1: us ob comp less 14 wk · 35 acquisitions, 15 frames shown]
[im 1/35]
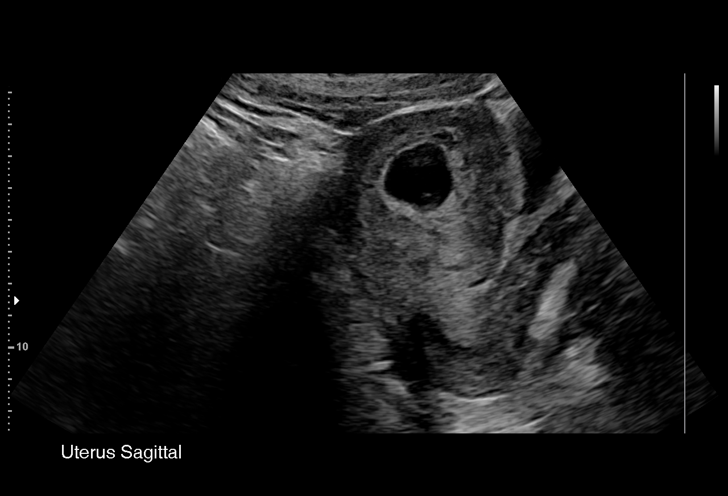
[im 3/35]
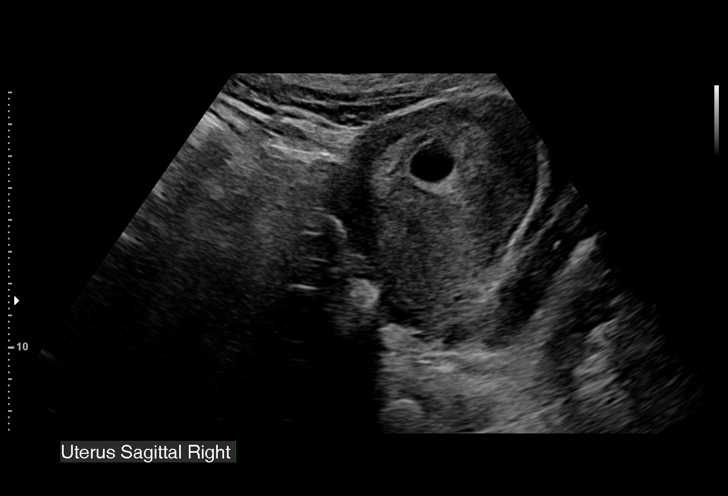
[im 6/35]
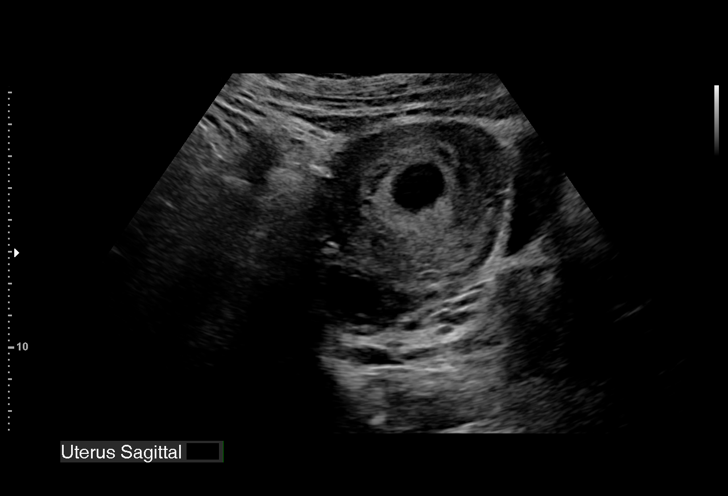
[im 8/35]
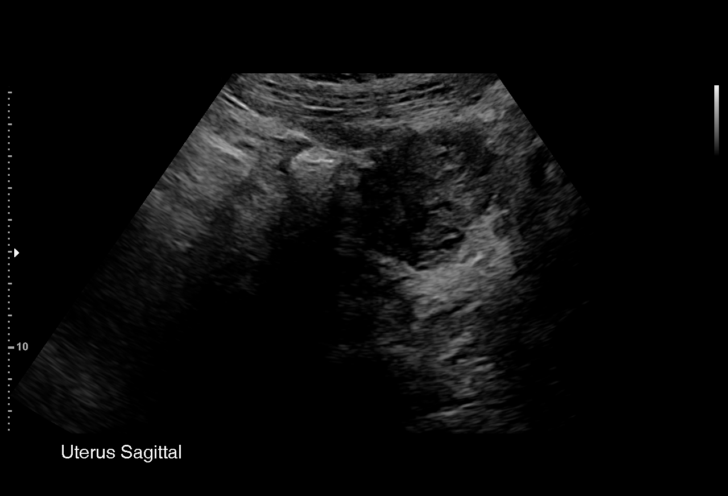
[im 11/35]
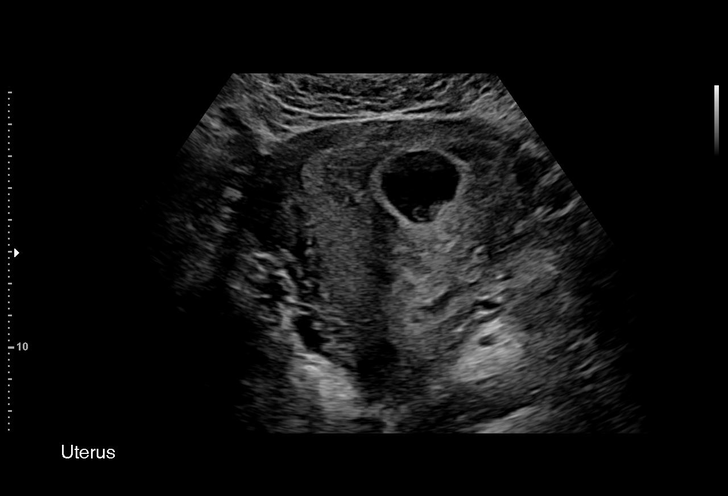
[im 13/35]
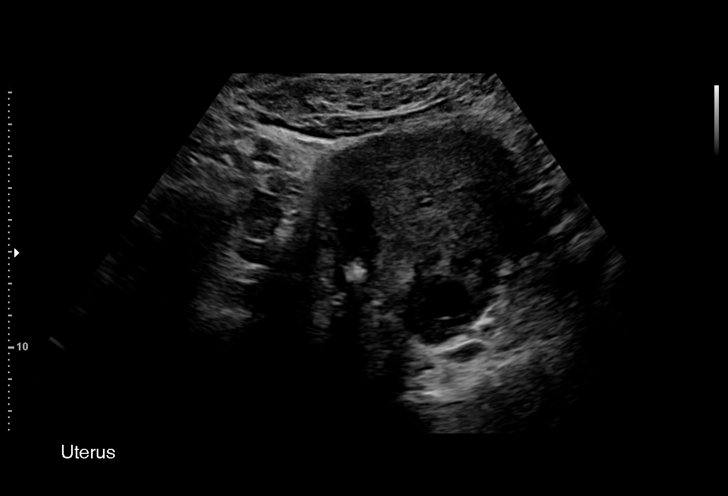
[im 16/35]
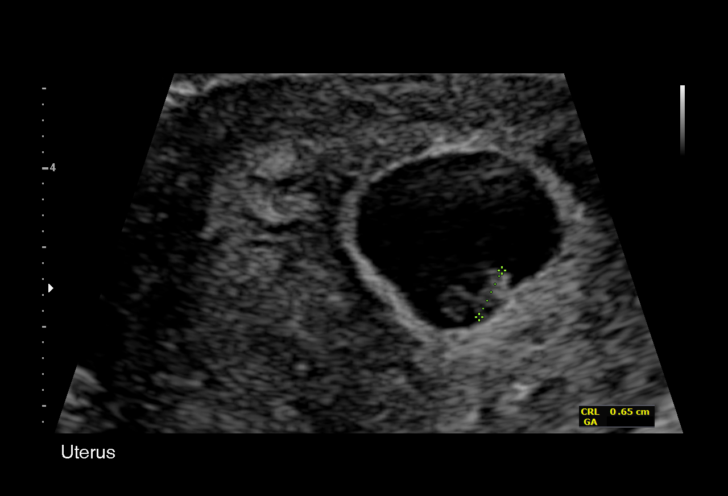
[im 18/35]
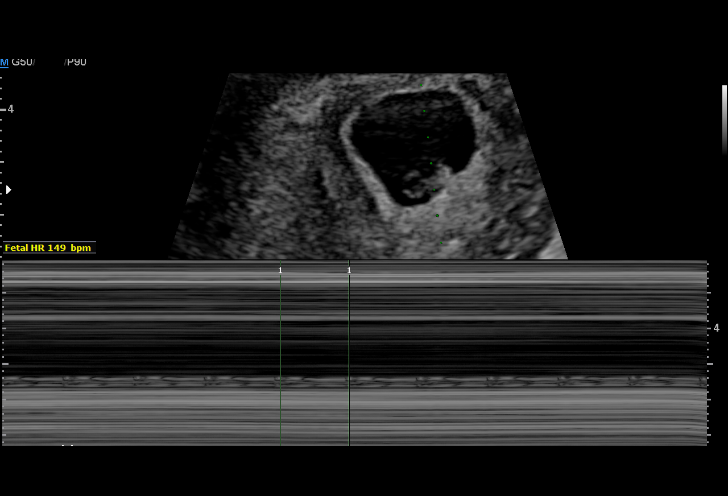
[im 19/35]
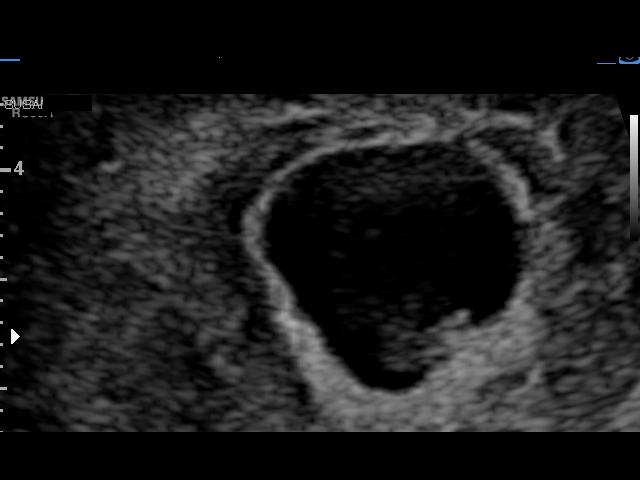
[im 22/35]
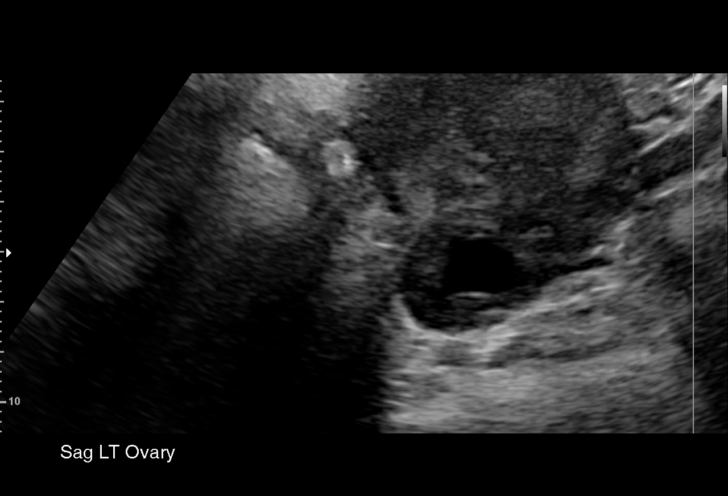
[im 24/35]
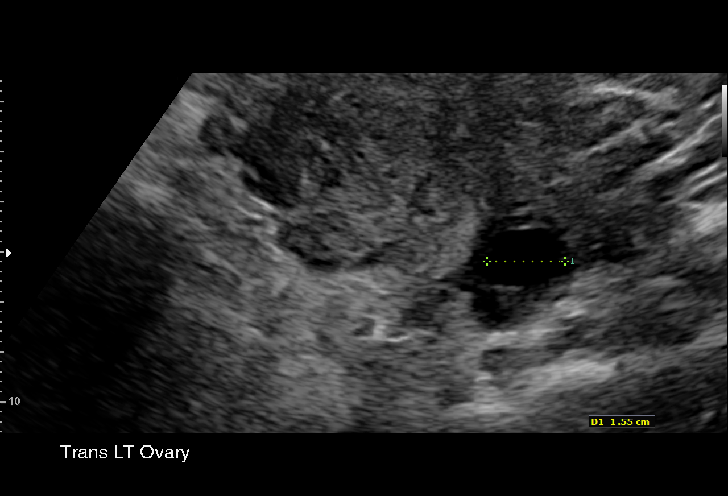
[im 27/35]
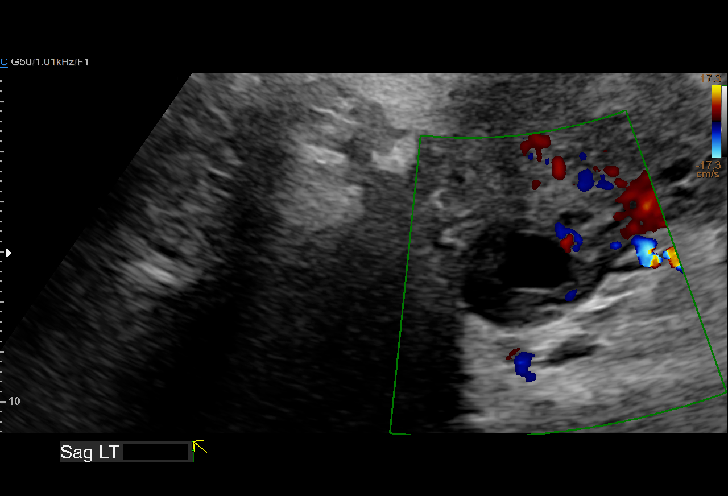
[im 29/35]
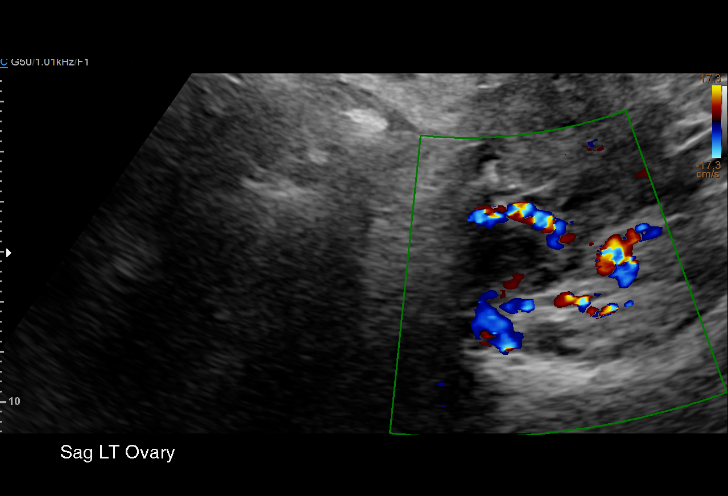
[im 32/35]
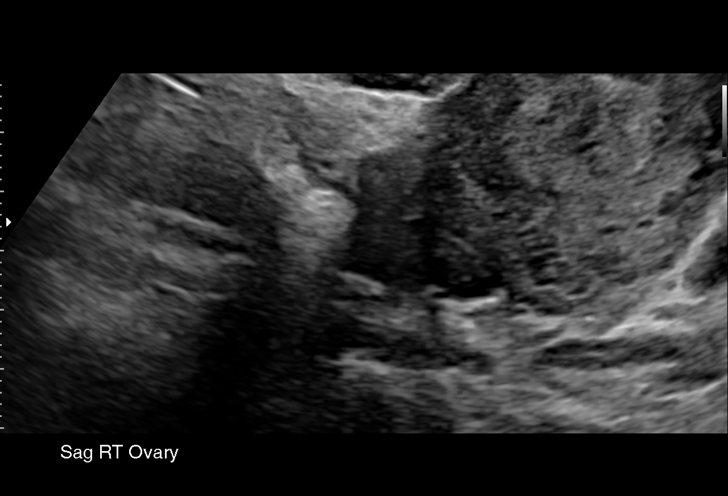
[im 35/35]
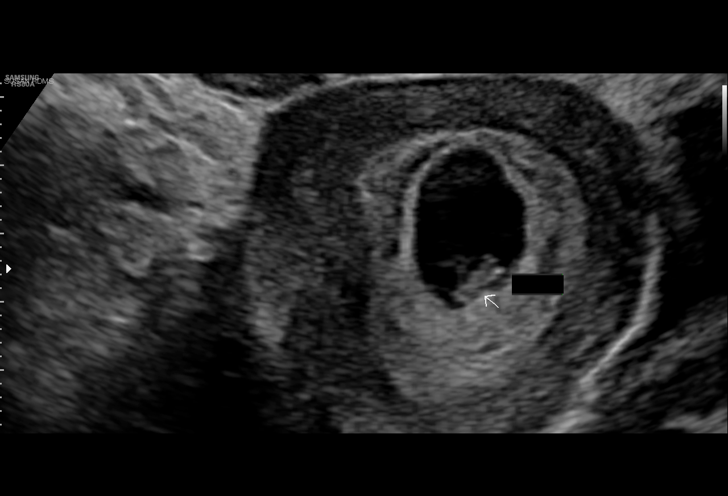

[15 of 28 positions shown; findings below may reference images not displayed]

FINDINGS: Intrauterine gestational sac: Single.

Yolk sac:  Visualized.

Embryo:  Visualized.

Cardiac Activity: Visualized.

Heart Rate: 149  bpm

CRL:  7.1  mm   6 w   4 d                  US EDC: 09/18/2017

Subchorionic hemorrhage:  None visualized.

Maternal uterus/adnexae: Unremarkable.
IMPRESSION: Single living intrauterine gestation at estimated 6 week 4 day
gestational age by crown-rump length.

## 2021-04-26 IMAGING — DX PORTABLE CHEST - 1 VIEW
1 series · 1 of 1 positions shown · non-contrast
Comparison: 03/14/2004

CLINICAL DATA: Short of breath

EXAM:
PORTABLE CHEST 1 VIEW

[chest ap]
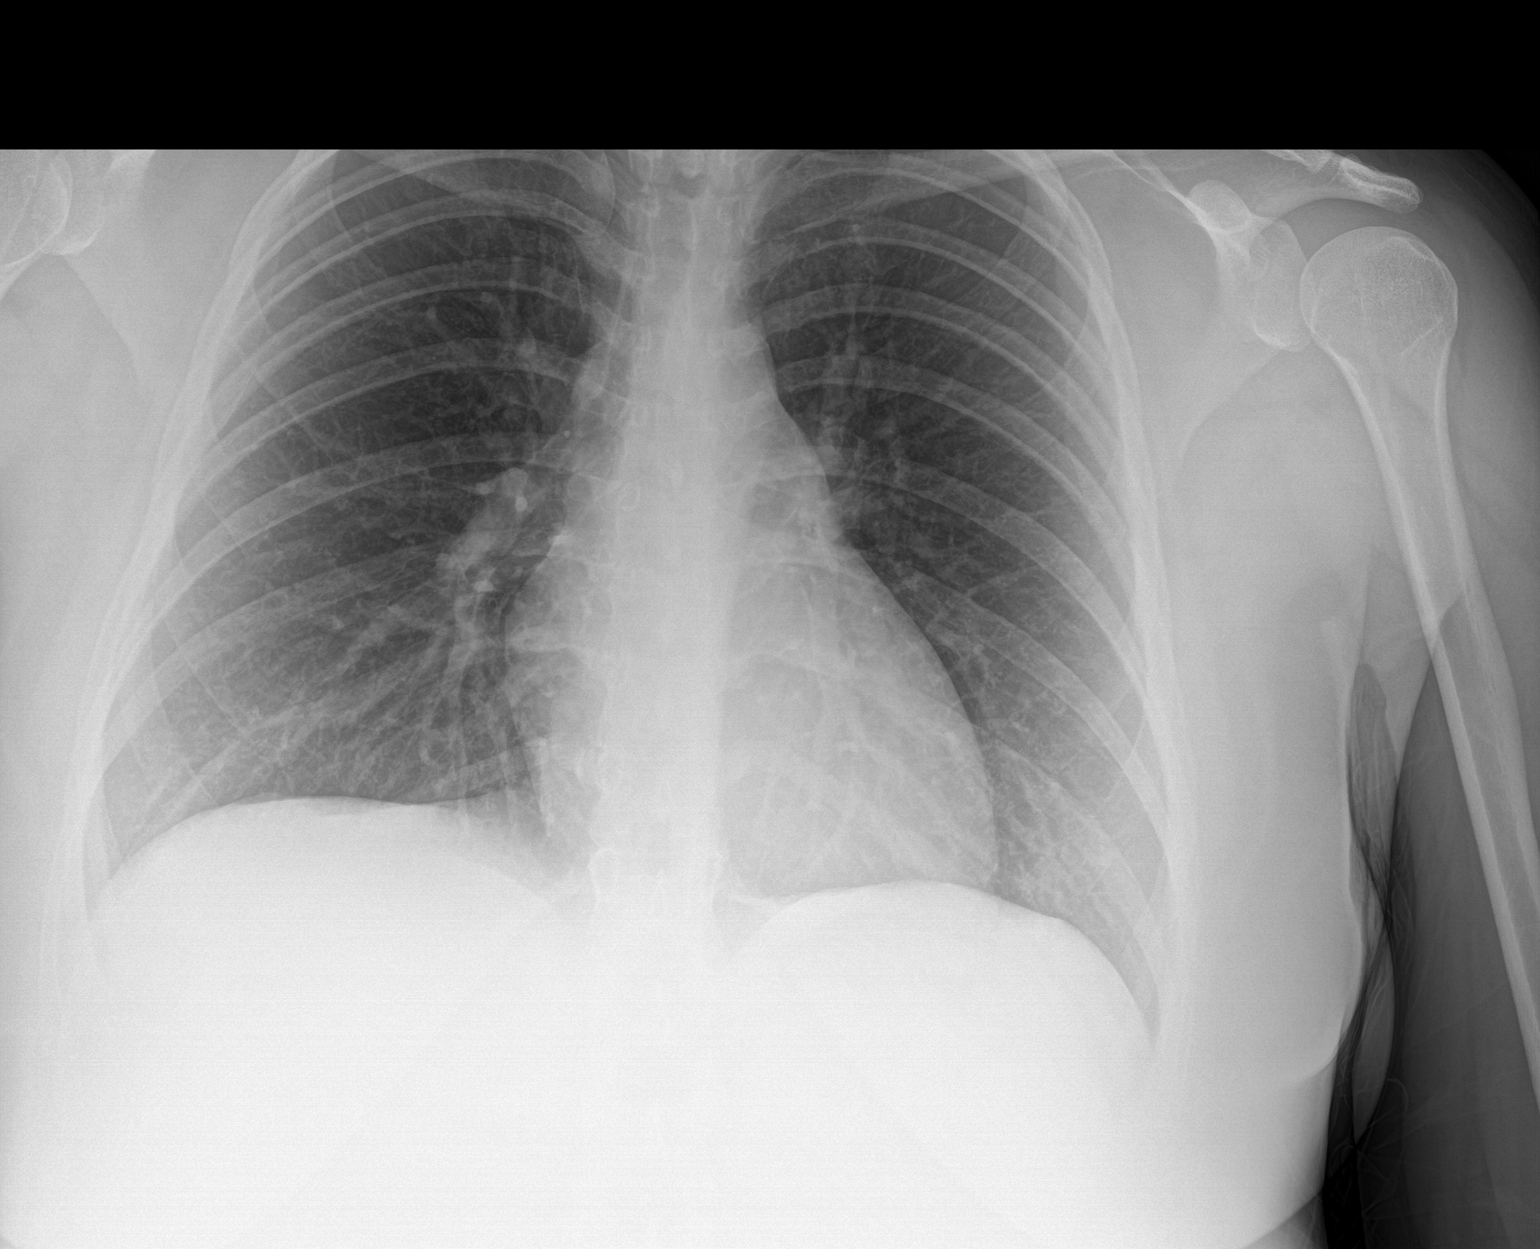

[1 of 1 positions shown; findings below may reference images not displayed]

FINDINGS: The heart size and mediastinal contours are within normal limits.
Both lungs are clear. The visualized skeletal structures are
unremarkable.
IMPRESSION: No active disease.
